# Patient Record
Sex: Male | Born: 1945 | Race: White | Hispanic: No | Marital: Married | Smoking: Former smoker
Health system: Southern US, Community
[De-identification: ages and names within clinical notes are randomized; demographics above are authoritative.]

## PROBLEM LIST (undated history)

## (undated) DIAGNOSIS — D869 Sarcoidosis, unspecified: Secondary | ICD-10-CM

## (undated) DIAGNOSIS — Z87442 Personal history of urinary calculi: Secondary | ICD-10-CM

## (undated) DIAGNOSIS — I2510 Atherosclerotic heart disease of native coronary artery without angina pectoris: Secondary | ICD-10-CM

## (undated) DIAGNOSIS — E119 Type 2 diabetes mellitus without complications: Secondary | ICD-10-CM

## (undated) DIAGNOSIS — I10 Essential (primary) hypertension: Secondary | ICD-10-CM

## (undated) DIAGNOSIS — G629 Polyneuropathy, unspecified: Secondary | ICD-10-CM

## (undated) DIAGNOSIS — I639 Cerebral infarction, unspecified: Secondary | ICD-10-CM

## (undated) DIAGNOSIS — C801 Malignant (primary) neoplasm, unspecified: Secondary | ICD-10-CM

## (undated) HISTORY — PX: KNEE SURGERY: SHX244

## (undated) HISTORY — PX: CARPAL TUNNEL RELEASE: SHX101

## (undated) HISTORY — PX: JOINT REPLACEMENT: SHX530

## (undated) HISTORY — PX: HERNIA REPAIR: SHX51

## (undated) HISTORY — PX: COLONOSCOPY: SHX174

## (undated) HISTORY — PX: TONSILLECTOMY: SUR1361

## (undated) HISTORY — PX: LAPAROSCOPIC ROUX-EN-Y GASTRIC BYPASS WITH UPPER ENDOSCOPY AND REMOVAL OF LAP BAND: SHX6505

## (undated) HISTORY — PX: CHOLECYSTECTOMY: SHX55

## (undated) HISTORY — PX: SPINAL FUSION: SHX223

---

## 2015-07-04 DIAGNOSIS — M47817 Spondylosis without myelopathy or radiculopathy, lumbosacral region: Secondary | ICD-10-CM | POA: Insufficient documentation

## 2015-07-24 DIAGNOSIS — M5126 Other intervertebral disc displacement, lumbar region: Secondary | ICD-10-CM | POA: Insufficient documentation

## 2017-06-30 DIAGNOSIS — I10 Essential (primary) hypertension: Secondary | ICD-10-CM | POA: Insufficient documentation

## 2018-01-29 DIAGNOSIS — M1712 Unilateral primary osteoarthritis, left knee: Secondary | ICD-10-CM | POA: Insufficient documentation

## 2019-07-06 ENCOUNTER — Other Ambulatory Visit: Payer: Self-pay | Admitting: Family Medicine

## 2019-07-06 DIAGNOSIS — M542 Cervicalgia: Secondary | ICD-10-CM

## 2019-07-06 DIAGNOSIS — M4722 Other spondylosis with radiculopathy, cervical region: Secondary | ICD-10-CM

## 2019-07-14 ENCOUNTER — Ambulatory Visit
Admission: RE | Admit: 2019-07-14 | Discharge: 2019-07-14 | Disposition: A | Discharge status: Home / Self Care | Payer: Medicare Other | Source: Ambulatory Visit | Attending: Family Medicine | Admitting: Family Medicine

## 2019-07-14 ENCOUNTER — Other Ambulatory Visit: Payer: Self-pay

## 2019-07-14 DIAGNOSIS — M4722 Other spondylosis with radiculopathy, cervical region: Secondary | ICD-10-CM

## 2019-07-14 DIAGNOSIS — M542 Cervicalgia: Secondary | ICD-10-CM

## 2019-07-18 ENCOUNTER — Emergency Department (HOSPITAL_COMMUNITY): Payer: Medicare Other

## 2019-07-18 ENCOUNTER — Encounter (HOSPITAL_COMMUNITY): Payer: Self-pay

## 2019-07-18 ENCOUNTER — Emergency Department (HOSPITAL_COMMUNITY)
Admission: EM | Admit: 2019-07-18 | Discharge: 2019-07-18 | Disposition: A | Discharge status: Home / Self Care | Payer: Medicare Other | Attending: Emergency Medicine | Admitting: Emergency Medicine

## 2019-07-18 ENCOUNTER — Other Ambulatory Visit: Payer: Self-pay

## 2019-07-18 DIAGNOSIS — I10 Essential (primary) hypertension: Secondary | ICD-10-CM | POA: Insufficient documentation

## 2019-07-18 DIAGNOSIS — N132 Hydronephrosis with renal and ureteral calculous obstruction: Secondary | ICD-10-CM | POA: Insufficient documentation

## 2019-07-18 DIAGNOSIS — E119 Type 2 diabetes mellitus without complications: Secondary | ICD-10-CM | POA: Insufficient documentation

## 2019-07-18 DIAGNOSIS — Z7984 Long term (current) use of oral hypoglycemic drugs: Secondary | ICD-10-CM | POA: Insufficient documentation

## 2019-07-18 DIAGNOSIS — Z79899 Other long term (current) drug therapy: Secondary | ICD-10-CM | POA: Insufficient documentation

## 2019-07-18 DIAGNOSIS — R1011 Right upper quadrant pain: Secondary | ICD-10-CM | POA: Diagnosis present

## 2019-07-18 DIAGNOSIS — N200 Calculus of kidney: Secondary | ICD-10-CM

## 2019-07-18 HISTORY — DX: Type 2 diabetes mellitus without complications: E11.9

## 2019-07-18 HISTORY — DX: Essential (primary) hypertension: I10

## 2019-07-18 LAB — CBC WITH DIFFERENTIAL/PLATELET
Abs Immature Granulocytes: 0.06 10*3/uL (ref 0.00–0.07)
Basophils Absolute: 0.1 10*3/uL (ref 0.0–0.1)
Basophils Relative: 1 %
Eosinophils Absolute: 0.4 10*3/uL (ref 0.0–0.5)
Eosinophils Relative: 3 %
HCT: 45.7 % (ref 39.0–52.0)
Hemoglobin: 15.4 g/dL (ref 13.0–17.0)
Immature Granulocytes: 1 %
Lymphocytes Relative: 9 %
Lymphs Abs: 1.2 10*3/uL (ref 0.7–4.0)
MCH: 33.1 pg (ref 26.0–34.0)
MCHC: 33.7 g/dL (ref 30.0–36.0)
MCV: 98.3 fL (ref 80.0–100.0)
Monocytes Absolute: 1.1 10*3/uL — ABNORMAL HIGH (ref 0.1–1.0)
Monocytes Relative: 8 %
Neutro Abs: 10.1 10*3/uL — ABNORMAL HIGH (ref 1.7–7.7)
Neutrophils Relative %: 78 %
Platelets: 213 10*3/uL (ref 150–400)
RBC: 4.65 MIL/uL (ref 4.22–5.81)
RDW: 12.3 % (ref 11.5–15.5)
WBC: 13 10*3/uL — ABNORMAL HIGH (ref 4.0–10.5)
nRBC: 0 % (ref 0.0–0.2)

## 2019-07-18 LAB — URINALYSIS, ROUTINE W REFLEX MICROSCOPIC
Bacteria, UA: NONE SEEN
Bilirubin Urine: NEGATIVE
Glucose, UA: NEGATIVE mg/dL
Ketones, ur: NEGATIVE mg/dL
Leukocytes,Ua: NEGATIVE
Nitrite: NEGATIVE
Protein, ur: NEGATIVE mg/dL
Specific Gravity, Urine: 1.012 (ref 1.005–1.030)
pH: 5 (ref 5.0–8.0)

## 2019-07-18 LAB — COMPREHENSIVE METABOLIC PANEL
ALT: 49 U/L — ABNORMAL HIGH (ref 0–44)
AST: 35 U/L (ref 15–41)
Albumin: 4.6 g/dL (ref 3.5–5.0)
Alkaline Phosphatase: 52 U/L (ref 38–126)
Anion gap: 14 (ref 5–15)
BUN: 20 mg/dL (ref 8–23)
CO2: 23 mmol/L (ref 22–32)
Calcium: 9.8 mg/dL (ref 8.9–10.3)
Chloride: 101 mmol/L (ref 98–111)
Creatinine, Ser: 0.84 mg/dL (ref 0.61–1.24)
GFR calc Af Amer: >60 mL/min (ref >60)
GFR calc non Af Amer: >60 mL/min (ref >60)
Glucose, Bld: 180 mg/dL — ABNORMAL HIGH (ref 70–99)
Potassium: 4.5 mmol/L (ref 3.5–5.1)
Sodium: 138 mmol/L (ref 135–145)
Total Bilirubin: 1 mg/dL (ref 0.3–1.2)
Total Protein: 7.6 g/dL (ref 6.5–8.1)

## 2019-07-18 LAB — LIPASE, BLOOD: Lipase: 36 U/L (ref 11–51)

## 2019-07-18 MED ORDER — IOHEXOL 300 MG/ML  SOLN
100.0000 mL | Freq: Once | INTRAMUSCULAR | Status: AC | PRN
  Administered 2019-07-18: 15:00:00 100 mL via INTRAVENOUS

## 2019-07-18 MED ORDER — MORPHINE SULFATE 15 MG PO TABS: 15.0000 mg | ORAL_TABLET | ORAL | 0 refills | Status: DC | PRN

## 2019-07-18 MED ORDER — FENTANYL CITRATE (PF) 100 MCG/2ML IJ SOLN
50.0000 ug | Freq: Once | INTRAMUSCULAR | Status: AC
  Administered 2019-07-18: 13:00:00 50 ug via INTRAVENOUS
  Filled 2019-07-18: qty 2

## 2019-07-18 MED ORDER — SODIUM CHLORIDE 0.9 % IV SOLN
INTRAVENOUS | Status: DC
  Administered 2019-07-18: 13:00:00 via INTRAVENOUS

## 2019-07-18 MED ORDER — TAMSULOSIN HCL 0.4 MG PO CAPS: 0.4000 mg | ORAL_CAPSULE | Freq: Every day | ORAL | 0 refills | Status: DC

## 2019-07-18 MED ORDER — HYDROMORPHONE HCL 1 MG/ML IJ SOLN
1.0000 mg | Freq: Once | INTRAMUSCULAR | Status: AC
  Administered 2019-07-18: 16:00:00 1 mg via INTRAVENOUS
  Filled 2019-07-18: qty 1

## 2019-07-18 MED ORDER — ONDANSETRON 4 MG PO TBDP: ORAL_TABLET | ORAL | 0 refills | Status: DC

## 2019-07-18 MED ORDER — HYDROMORPHONE HCL 1 MG/ML IJ SOLN
1.0000 mg | Freq: Once | INTRAMUSCULAR | Status: AC
  Administered 2019-07-18: 15:00:00 1 mg via INTRAVENOUS
  Filled 2019-07-18: qty 1

## 2019-07-18 MED ORDER — SODIUM CHLORIDE (PF) 0.9 % IJ SOLN
INTRAMUSCULAR | Status: AC
  Filled 2019-07-18: qty 50

## 2019-07-18 MED ORDER — ONDANSETRON HCL 4 MG/2ML IJ SOLN
4.0000 mg | Freq: Once | INTRAMUSCULAR | Status: AC
  Administered 2019-07-18: 13:00:00 4 mg via INTRAVENOUS
  Filled 2019-07-18: qty 2

## 2019-07-18 MED ORDER — HYDROMORPHONE HCL 1 MG/ML IJ SOLN
1.0000 mg | Freq: Once | INTRAMUSCULAR | Status: AC
  Administered 2019-07-18: 14:00:00 1 mg via INTRAVENOUS
  Filled 2019-07-18: qty 1

## 2019-07-18 MED ORDER — KETOROLAC TROMETHAMINE 30 MG/ML IJ SOLN
15.0000 mg | Freq: Once | INTRAMUSCULAR | Status: AC
  Administered 2019-07-18: 15 mg via INTRAVENOUS
  Filled 2019-07-18: qty 1

## 2019-07-18 NOTE — ED Notes (Signed)
Pt returned from CT °

## 2019-07-18 NOTE — ED Notes (Signed)
Patient transported to CT 

## 2019-07-18 NOTE — ED Provider Notes (Signed)
[facility] DEPT Provider Note   CSN: [id number] Arrival date & time: 07/18/19  1151     History   Chief Complaint Chief Complaint  Patient presents with   Abdominal Pain    HPI Max Ryan is a 73 y.o. male.     HPI Patient presents with concern of abdominal pain. Patient awoke today and was in his usual state of health, soon thereafter, about 2 hours prior to ED arrival he developed sharp pain, in the right lower quadrant. There is associated nausea, vomiting. Pain is sharp, severe, nonradiating, not improved with anything including OTC medication. No diarrhea, no dysuria, no hematuria.   Patient initially states that he has no history of abdominal surgery, but he does have a history of cholecystectomy.  Past Medical History:  Diagnosis Date   Diabetes mellitus without complication ([place])    Hypertension     There are no active problems to display for this patient.   Past Surgical History:  Procedure Laterality Date   CHOLECYSTECTOMY     HERNIA REPAIR     KNEE SURGERY Left    LAPAROSCOPIC ROUX-EN-Y GASTRIC BYPASS WITH UPPER ENDOSCOPY AND REMOVAL OF LAP BAND     SPINAL FUSION          Home Medications    Prior to Admission medications   Medication Sig Start Date End Date Taking? Authorizing Provider  Carbidopa 25 MG tablet Take 25 tablets by mouth 3 (three) times daily.   Yes [provider]  diclofenac (VOLTAREN) 50 MG EC tablet Take 50 mg by mouth 2 (two) times daily.   Yes [provider]  gabapentin (NEURONTIN) 600 MG tablet Take 600 mg by mouth 2 (two) times daily.   Yes [provider]  losartan (COZAAR) 25 MG tablet Take 25 mg by mouth daily.   Yes [provider]  lovastatin (MEVACOR) 10 MG tablet Take 10 mg by mouth daily.    Yes [provider]  metFORMIN (GLUCOPHAGE) 500 MG tablet Take 500 mg by mouth 2 (two) times daily.   Yes [provider]    oxyCODONE-acetaminophen (PERCOCET) 10-325 MG tablet Take 1 tablet by mouth every 4 (four) hours as needed for pain.   Yes [provider]    Family History No family history on file.  Social History Social History   Tobacco Use   Smoking status: Never Smoker   Smokeless tobacco: Never Used  Substance Use Topics   Alcohol use: Yes    Alcohol/week: 28.0 standard drinks    Types: 28 Glasses of wine per week   Drug use: Yes    Types: Marijuana     Allergies   Patient has no known allergies.   Review of Systems Review of Systems  Constitutional:       Per HPI, otherwise negative  HENT:       Per HPI, otherwise negative  Respiratory:       Per HPI, otherwise negative  Cardiovascular:       Per HPI, otherwise negative  Gastrointestinal: Positive for abdominal pain and nausea. Negative for vomiting.  Endocrine:       Negative aside from HPI  Genitourinary:       Neg aside from HPI   Musculoskeletal:       Per HPI, otherwise negative  Skin: Negative.   Neurological: Negative for syncope.     Physical Exam Updated Vital Signs BP (!) 188/108    Pulse 67  Temp 98.2 F (36.8 C) (Oral)    Resp 18    Ht 5\' 8"  (1.727 m)    Wt 113.4 kg    SpO2 92%    BMI 38.01 kg/m   Physical Exam Vitals signs and nursing note reviewed.  Constitutional:      Appearance: He is well-developed. He is obese.     Comments: Uncomfortable appearing large adult male awake and alert  HENT:     Head: Normocephalic and atraumatic.  Eyes:     Conjunctiva/sclera: Conjunctivae normal.  Cardiovascular:     Rate and Rhythm: Normal rate and regular rhythm.  Pulmonary:     Effort: Pulmonary effort is normal. No respiratory distress.     Breath sounds: No stridor.  Abdominal:     General: There is no distension.     Tenderness: There is abdominal tenderness in the right lower quadrant.  Skin:    General: Skin is warm and dry.  Neurological:     Mental Status: He is alert and  oriented to person, place, and time.      ED Treatments / Results  Labs (all labs ordered are listed, but only abnormal results are displayed) Labs Reviewed  COMPREHENSIVE METABOLIC PANEL - Abnormal; Notable for the following components:      Result Value   Glucose, Bld 180 (*)    ALT 49 (*)    All other components within normal limits  CBC WITH DIFFERENTIAL/PLATELET - Abnormal; Notable for the following components:   WBC 13.0 (*)    Neutro Abs 10.1 (*)    Monocytes Absolute 1.1 (*)    All other components within normal limits  URINALYSIS, ROUTINE W REFLEX MICROSCOPIC - Abnormal; Notable for the following components:   Hgb urine dipstick MODERATE (*)    All other components within normal limits  LIPASE, BLOOD    EKG None  Radiology Ct Abdomen Pelvis W Contrast  Result Date: 07/18/2019 CLINICAL DATA:  Right lower quadrant abdominal pain for the past 2 hours. Nausea and 1 episode of vomiting. EXAM: CT ABDOMEN AND PELVIS WITH CONTRAST TECHNIQUE: Multidetector CT imaging of the abdomen and pelvis was performed using the standard protocol following bolus administration of intravenous contrast. CONTRAST:  19mL OMNIPAQUE IOHEXOL 300 MG/ML  SOLN COMPARISON:  None. FINDINGS: Lower chest: Atheromatous coronary artery calcifications. Subsegmental atelectasis or scarring in the left lower lobe and lingula. Small right lower lobe calcified granuloma. Hepatobiliary: Marked diffuse low density of the liver relative to the spleen. Cholecystectomy clips. Pancreas: Unremarkable. No pancreatic ductal dilatation or surrounding inflammatory changes. Spleen: Normal in size without focal abnormality. 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 There is a 6 mm calculus in the distal ureter just proximal to the ureterovesical junction and a 2 mm calculus at the ureterovesical junction. There is  significantly delayed contrast excretion on the right, with no collecting system contrast on the delayed images. Normal appearing collecting system and ureter on the left. Unremarkable urinary bladder. Stomach/Bowel: Proximal gastric band and associated tubing. Normal appearing small bowel, colon and appendix. Vascular/Lymphatic: Atheromatous arterial calcifications without aneurysm. No enlarged lymph nodes. Reproductive: Normal sized prostate gland containing coarse calcifications. Other: Small to moderate-sized bilateral inguinal hernias containing fat. Tiny umbilical hernia containing fat. Musculoskeletal: Interbody and pedicle screw and rod fixation at the L3 through S1 levels. Lumbar and lower thoracic spine degenerative changes. IMPRESSION: 1. 6 mm and 2 mm distal right ureteral  calculi, causing mild-to-moderate right hydronephrosis and hydroureter. 2. Horseshoe kidney. 3. Marked diffuse hepatic steatosis. 4. Small to moderate-sized bilateral inguinal hernias containing fat. Electronically Signed   By: Claudie Revering M.D.   On: 07/18/2019 15:24    Procedures Procedures (including critical care time)  Medications Ordered in ED Medications  0.9 %  sodium chloride infusion ( Intravenous Bolus from Bag 07/18/19 1238)  sodium chloride (PF) 0.9 % injection (has no administration in time range)  fentaNYL (SUBLIMAZE) injection 50 mcg (50 mcg Intravenous Given 07/18/19 1239)  ondansetron (ZOFRAN) injection 4 mg (4 mg Intravenous Given 07/18/19 1238)  HYDROmorphone (DILAUDID) injection 1 mg (1 mg Intravenous Given 07/18/19 1336)  iohexol (OMNIPAQUE) 300 MG/ML solution 100 mL (100 mLs Intravenous Contrast Given 07/18/19 1453)  HYDROmorphone (DILAUDID) injection 1 mg (1 mg Intravenous Given 07/18/19 1519)     Initial Impression / Assessment and Plan / ED Course  I have reviewed the triage vital signs and the nursing notes.  Pertinent labs & imaging results that were available during my care of the patient were  reviewed by me and considered in my medical decision making (see chart for details).    3:32 PM Patient much more comfortable after Dilaudid, after initial fentanyl did not change his pain substantially. Patient went for CT scan, but IV was dislodged, and there is been a delay in care. Initial labs notable for leukocytosis, with left shift, heme positive urine, with concern for infection versus stone.  Patient now completed by his wife who is aware of findings thus far.    3:32 PM Patient's pain somewhat controlled, though he has recurrence, requiring additional Dilaudid. Patient signed out to Dr. Tyrone Nine, with concern for stone versus appendectomy, with CT results pending.  On chart review is clear the patient had horseshoe kidney, 2 right-sided ureteral stones, requiring additional monitoring, management, performed by Dr. Tyrone Nine.  Final Clinical Impressions(s) / ED Diagnoses  Nephrolithiasis   Carmin Muskrat, MD 07/18/19 [phone]

## 2019-07-18 NOTE — ED Notes (Addendum)
Informed patient that a urine sample is needed. Pt stating he cannot provide urine sample at this time. Encouraged pt to try. Pt is sitting on the side of the bed at this time and family is at bedside.

## 2019-07-18 NOTE — ED Notes (Signed)
Urine culture sent to lab with urine sample.  

## 2019-07-18 NOTE — ED Notes (Signed)
Pt c/o pains are back up in right flank, rating 7/10. Made Dr Vanita Panda aware, awaiting new orders.

## 2019-07-18 NOTE — Discharge Instructions (Addendum)
As discussed please return for worsening pain or fever follow up with urology.   Take 4 over the counter ibuprofen tablets 3 times a day or 2 over-the-counter naproxen tablets twice a day for pain. Also take tylenol 1000mg (2 extra strength) four times a day.   Then take the pain medicine if you feel like you need it. Narcotics do not help with the pain, they only make you care about it less.  You can become addicted to this, people may break into your house to steal it.  It will constipate you.  If you drive under the influence of this medicine you can get a DUI.

## 2019-07-18 NOTE — ED Notes (Signed)
ED Provider at bedside. 

## 2019-07-18 NOTE — ED Triage Notes (Signed)
Pt BIBA from home. Per EMS, pt c/o RLQ pain x 2 hours. Endorses nausea, emesis x 1. No other complaints at this time.  180/100

## 2019-07-18 NOTE — ED Provider Notes (Signed)
73 yo M with a chief complaints of right flank pain.  Started 2 hours prior to arrival here.  I received the patient in signout from Dr. Vanita Panda.  Patient awaiting read from CT scan.  Found to have 2 stones at the UVJ.  Patient is having severe pain after 3 doses of narcotics.  Will discuss with urology.  Discussed with Dr. Jeffie Pollock.  He felt it was okay to give this patient Toradol.  Patient given Toradol and another milligram of Dilaudid with significant improvement of his symptoms.  He was observed in the ED for about an hour and a half afterwards and was still feeling well.  Requesting discharge home.  Urology follow-up.   Deno Etienne, DO 07/18/19 2002

## 2019-07-19 ENCOUNTER — Encounter (HOSPITAL_COMMUNITY)
Admission: AD | Disposition: A | Discharge status: Home / Self Care | Payer: Self-pay | Source: Ambulatory Visit | Attending: Urology

## 2019-07-19 ENCOUNTER — Ambulatory Visit (HOSPITAL_COMMUNITY): Payer: Medicare Other

## 2019-07-19 ENCOUNTER — Ambulatory Visit (HOSPITAL_COMMUNITY): Payer: Medicare Other | Admitting: Anesthesiology

## 2019-07-19 ENCOUNTER — Ambulatory Visit (HOSPITAL_COMMUNITY)
Admission: AD | Admit: 2019-07-19 | Discharge: 2019-07-19 | Disposition: A | Discharge status: Home / Self Care | Payer: Medicare Other | Source: Ambulatory Visit | Attending: Urology | Admitting: Urology

## 2019-07-19 ENCOUNTER — Other Ambulatory Visit: Payer: Self-pay | Admitting: Urology

## 2019-07-19 ENCOUNTER — Other Ambulatory Visit (HOSPITAL_COMMUNITY)
Admission: RE | Admit: 2019-07-19 | Discharge: 2019-07-19 | Disposition: A | Discharge status: Home / Self Care | Payer: Medicare Other | Source: Ambulatory Visit | Attending: Urology | Admitting: Urology

## 2019-07-19 ENCOUNTER — Encounter (HOSPITAL_COMMUNITY): Payer: Self-pay | Admitting: General Practice

## 2019-07-19 DIAGNOSIS — M1990 Unspecified osteoarthritis, unspecified site: Secondary | ICD-10-CM | POA: Diagnosis not present

## 2019-07-19 DIAGNOSIS — N201 Calculus of ureter: Secondary | ICD-10-CM | POA: Diagnosis present

## 2019-07-19 DIAGNOSIS — Z79899 Other long term (current) drug therapy: Secondary | ICD-10-CM | POA: Diagnosis not present

## 2019-07-19 DIAGNOSIS — N200 Calculus of kidney: Secondary | ICD-10-CM

## 2019-07-19 DIAGNOSIS — I10 Essential (primary) hypertension: Secondary | ICD-10-CM | POA: Diagnosis not present

## 2019-07-19 DIAGNOSIS — Q631 Lobulated, fused and horseshoe kidney: Secondary | ICD-10-CM | POA: Diagnosis not present

## 2019-07-19 DIAGNOSIS — E119 Type 2 diabetes mellitus without complications: Secondary | ICD-10-CM | POA: Diagnosis not present

## 2019-07-19 DIAGNOSIS — Z87891 Personal history of nicotine dependence: Secondary | ICD-10-CM | POA: Insufficient documentation

## 2019-07-19 DIAGNOSIS — M109 Gout, unspecified: Secondary | ICD-10-CM | POA: Insufficient documentation

## 2019-07-19 DIAGNOSIS — Z20828 Contact with and (suspected) exposure to other viral communicable diseases: Secondary | ICD-10-CM | POA: Insufficient documentation

## 2019-07-19 DIAGNOSIS — E7800 Pure hypercholesterolemia, unspecified: Secondary | ICD-10-CM | POA: Diagnosis not present

## 2019-07-19 DIAGNOSIS — Z7984 Long term (current) use of oral hypoglycemic drugs: Secondary | ICD-10-CM | POA: Diagnosis not present

## 2019-07-19 DIAGNOSIS — N400 Enlarged prostate without lower urinary tract symptoms: Secondary | ICD-10-CM | POA: Diagnosis not present

## 2019-07-19 DIAGNOSIS — G4730 Sleep apnea, unspecified: Secondary | ICD-10-CM | POA: Diagnosis not present

## 2019-07-19 HISTORY — PX: CYSTOSCOPY/URETEROSCOPY/HOLMIUM LASER/STENT PLACEMENT: SHX6546

## 2019-07-19 HISTORY — DX: Personal history of urinary calculi: Z87.442

## 2019-07-19 LAB — GLUCOSE, CAPILLARY
Glucose-Capillary: 156 mg/dL — ABNORMAL HIGH (ref 70–99)
Glucose-Capillary: 164 mg/dL — ABNORMAL HIGH (ref 70–99)

## 2019-07-19 LAB — SARS CORONAVIRUS 2 BY RT PCR (HOSPITAL ORDER, PERFORMED IN ~~LOC~~ HOSPITAL LAB): SARS Coronavirus 2: NEGATIVE

## 2019-07-19 SURGERY — CYSTOSCOPY/URETEROSCOPY/HOLMIUM LASER/STENT PLACEMENT
Anesthesia: General | Site: Ureter | Laterality: Right

## 2019-07-19 MED ORDER — SODIUM CHLORIDE 0.9 % IV SOLN
INTRAVENOUS | Status: DC | PRN
  Administered 2019-07-19: 20 mL
  Administered 2019-07-19: 4 mL

## 2019-07-19 MED ORDER — PROMETHAZINE HCL 25 MG/ML IJ SOLN: 6.2500 mg | INTRAMUSCULAR | Status: DC | PRN

## 2019-07-19 MED ORDER — TRAMADOL HCL 50 MG PO TABS: 50.0000 mg | ORAL_TABLET | Freq: Four times a day (QID) | ORAL | 0 refills | Status: DC | PRN

## 2019-07-19 MED ORDER — PHENAZOPYRIDINE HCL 200 MG PO TABS: 200.0000 mg | ORAL_TABLET | Freq: Three times a day (TID) | ORAL | 0 refills | Status: DC | PRN

## 2019-07-19 MED ORDER — PHENYLEPHRINE 40 MCG/ML (10ML) SYRINGE FOR IV PUSH (FOR BLOOD PRESSURE SUPPORT)
PREFILLED_SYRINGE | INTRAVENOUS | Status: AC
  Filled 2019-07-19: qty 30

## 2019-07-19 MED ORDER — PHENYLEPHRINE 40 MCG/ML (10ML) SYRINGE FOR IV PUSH (FOR BLOOD PRESSURE SUPPORT)
PREFILLED_SYRINGE | INTRAVENOUS | Status: DC | PRN
  Administered 2019-07-19 (×2): 120 ug via INTRAVENOUS
  Administered 2019-07-19: 80 ug via INTRAVENOUS
  Administered 2019-07-19: 120 ug via INTRAVENOUS
  Administered 2019-07-19: 80 ug via INTRAVENOUS
  Administered 2019-07-19 (×2): 120 ug via INTRAVENOUS

## 2019-07-19 MED ORDER — FENTANYL CITRATE (PF) 100 MCG/2ML IJ SOLN: 25.0000 ug | INTRAMUSCULAR | Status: DC | PRN

## 2019-07-19 MED ORDER — LIDOCAINE 2% (20 MG/ML) 5 ML SYRINGE
INTRAMUSCULAR | Status: DC | PRN
  Administered 2019-07-19: 100 mg via INTRAVENOUS

## 2019-07-19 MED ORDER — ONDANSETRON HCL 4 MG/2ML IJ SOLN
INTRAMUSCULAR | Status: DC | PRN
  Administered 2019-07-19: 4 mg via INTRAVENOUS

## 2019-07-19 MED ORDER — ACETAMINOPHEN 500 MG PO TABS
1000.0000 mg | ORAL_TABLET | Freq: Once | ORAL | Status: AC
  Administered 2019-07-19: 15:00:00 1000 mg via ORAL
  Filled 2019-07-19: qty 2

## 2019-07-19 MED ORDER — PHENYLEPHRINE 40 MCG/ML (10ML) SYRINGE FOR IV PUSH (FOR BLOOD PRESSURE SUPPORT)
PREFILLED_SYRINGE | INTRAVENOUS | Status: AC
  Filled 2019-07-19: qty 10

## 2019-07-19 MED ORDER — LACTATED RINGERS IV SOLN
INTRAVENOUS | Status: DC
  Administered 2019-07-19 (×2): via INTRAVENOUS

## 2019-07-19 MED ORDER — EPHEDRINE SULFATE-NACL 50-0.9 MG/10ML-% IV SOSY
PREFILLED_SYRINGE | INTRAVENOUS | Status: DC | PRN
  Administered 2019-07-19 (×4): 5 mg via INTRAVENOUS

## 2019-07-19 MED ORDER — CELECOXIB 200 MG PO CAPS
400.0000 mg | ORAL_CAPSULE | Freq: Once | ORAL | Status: AC
  Administered 2019-07-19: 15:00:00 400 mg via ORAL
  Filled 2019-07-19 (×2): qty 2

## 2019-07-19 MED ORDER — CIPROFLOXACIN HCL 500 MG PO TABS: 500.0000 mg | ORAL_TABLET | Freq: Once | ORAL | 0 refills | Status: AC

## 2019-07-19 MED ORDER — LIDOCAINE 2% (20 MG/ML) 5 ML SYRINGE
INTRAMUSCULAR | Status: AC
  Filled 2019-07-19: qty 5

## 2019-07-19 MED ORDER — PROPOFOL 10 MG/ML IV BOLUS
INTRAVENOUS | Status: DC | PRN
  Administered 2019-07-19: 20 mg via INTRAVENOUS
  Administered 2019-07-19: 100 mg via INTRAVENOUS

## 2019-07-19 MED ORDER — FENTANYL CITRATE (PF) 100 MCG/2ML IJ SOLN
INTRAMUSCULAR | Status: DC | PRN
  Administered 2019-07-19 (×3): 50 ug via INTRAVENOUS

## 2019-07-19 MED ORDER — DEXAMETHASONE SODIUM PHOSPHATE 10 MG/ML IJ SOLN
INTRAMUSCULAR | Status: AC
  Filled 2019-07-19: qty 1

## 2019-07-19 MED ORDER — ONDANSETRON HCL 4 MG/2ML IJ SOLN
INTRAMUSCULAR | Status: AC
  Filled 2019-07-19: qty 2

## 2019-07-19 MED ORDER — SODIUM CHLORIDE 0.9 % IR SOLN
Status: DC | PRN
  Administered 2019-07-19 (×2): 3000 mL via INTRAVESICAL

## 2019-07-19 MED ORDER — DEXAMETHASONE SODIUM PHOSPHATE 10 MG/ML IJ SOLN
INTRAMUSCULAR | Status: DC | PRN
  Administered 2019-07-19: 10 mg via INTRAVENOUS

## 2019-07-19 MED ORDER — FENTANYL CITRATE (PF) 100 MCG/2ML IJ SOLN
INTRAMUSCULAR | Status: AC
  Filled 2019-07-19: qty 2

## 2019-07-19 MED ORDER — CEFAZOLIN SODIUM-DEXTROSE 2-4 GM/100ML-% IV SOLN
2.0000 g | INTRAVENOUS | Status: AC
  Administered 2019-07-19: 16:00:00 2 g via INTRAVENOUS
  Filled 2019-07-19: qty 100

## 2019-07-19 MED ORDER — 0.9 % SODIUM CHLORIDE (POUR BTL) OPTIME
TOPICAL | Status: DC | PRN
  Administered 2019-07-19: 1000 mL

## 2019-07-19 SURGICAL SUPPLY — 25 items
BAG URO CATCHER STRL LF (MISCELLANEOUS) ×3 IMPLANT
BASKET ZERO TIP NITINOL 2.4FR (BASKET) IMPLANT
BENZOIN TINCTURE PRP APPL 2/3 (GAUZE/BANDAGES/DRESSINGS) ×2 IMPLANT
CATH URET 5FR 28IN OPEN ENDED (CATHETERS) ×3 IMPLANT
CLOTH BEACON ORANGE TIMEOUT ST (SAFETY) ×3 IMPLANT
COVER WAND RF STERILE (DRAPES) IMPLANT
DRSG TEGADERM 2-3/8X2-3/4 SM (GAUZE/BANDAGES/DRESSINGS) ×2 IMPLANT
EXTRACTOR STONE 1.7FRX115CM (UROLOGICAL SUPPLIES) IMPLANT
EXTRACTOR STONE NITINOL NGAGE (UROLOGICAL SUPPLIES) ×2 IMPLANT
FIBER LASER FLEXIVA 365 (UROLOGICAL SUPPLIES) ×2 IMPLANT
GLOVE BIOGEL M STRL SZ7.5 (GLOVE) ×3 IMPLANT
GOWN STRL REUS W/TWL XL LVL3 (GOWN DISPOSABLE) ×3 IMPLANT
GUIDEWIRE ANG ZIPWIRE 038X150 (WIRE) IMPLANT
GUIDEWIRE STR DUAL SENSOR (WIRE) ×5 IMPLANT
KIT TURNOVER KIT A (KITS) IMPLANT
MANIFOLD NEPTUNE II (INSTRUMENTS) ×3 IMPLANT
PACK CYSTO (CUSTOM PROCEDURE TRAY) ×3 IMPLANT
SHEATH URETERAL 12FRX28CM (UROLOGICAL SUPPLIES) IMPLANT
SHEATH URETERAL 12FRX35CM (MISCELLANEOUS) IMPLANT
STENT URET 6FRX26 CONTOUR (STENTS) ×2 IMPLANT
SYRINGE IRR TOOMEY STRL 70CC (SYRINGE) ×2 IMPLANT
TUBING CONNECTING 10 (TUBING) ×2 IMPLANT
TUBING CONNECTING 10' (TUBING) ×1
TUBING UROLOGY SET (TUBING) ×3 IMPLANT
WIRE COONS/BENSON .038X145CM (WIRE) IMPLANT

## 2019-07-19 NOTE — Discharge Instructions (Signed)

## 2019-07-19 NOTE — Anesthesia Preprocedure Evaluation (Addendum)
Anesthesia Evaluation  Patient identified by MRN, date of birth, ID band Patient awake    Reviewed: Allergy & Precautions, NPO status , Patient's Chart, lab work & pertinent test results  History of Anesthesia Complications Negative for: history of anesthetic complications  Airway Mallampati: II  TM Distance: >3 FB Neck ROM: Full    Dental no notable dental hx. (+) Dental Advisory Given   Pulmonary neg pulmonary ROS,    Pulmonary exam normal        Cardiovascular hypertension, Pt. 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 neurological ROS  negative psych ROS   GI/Hepatic negative GI ROS, Neg liver ROS,   Endo/Other  diabetes  Renal/GU   negative genitourinary   Musculoskeletal negative musculoskeletal ROS (+)   Abdominal   Peds negative pediatric ROS (+)  Hematology negative hematology ROS (+)   Anesthesia Other Findings   Reproductive/Obstetrics negative OB ROS                           Anesthesia Physical Anesthesia Plan  ASA: III  Anesthesia Plan: General   Post-op Pain Management:    Induction: Intravenous  PONV Risk Score and Plan: 2 and Ondansetron and Dexamethasone  Airway Management Planned: LMA  Additional Equipment:   Intra-op Plan:   Post-operative Plan: Extubation in OR  Informed Consent: I have reviewed the patients History and Physical, chart, labs and discussed the procedure including the risks, benefits and alternatives for the proposed anesthesia with the patient or authorized representative who has indicated his/her understanding and acceptance.     Dental advisory given  Plan Discussed with: CRNA and Anesthesiologist  Anesthesia Plan Comments:         Anesthesia Quick Evaluation

## 2019-07-19 NOTE — Interval H&P Note (Signed)
History and Physical Interval Note:  07/19/2019 3:38 PM  Max Ryan  has presented today for surgery, with the diagnosis of right ureteral obstruction.  The various methods of treatment have been discussed with the patient and family. After consideration of risks, benefits and other options for treatment, the patient has consented to  Procedure(s): CYSTOSCOPY/URETEROSCOPY/HOLMIUM LASER/STENT PLACEMENT (Right) as a surgical intervention.  The patient's history has been reviewed, patient examined, no change in status, stable for surgery.  I have reviewed the patient's chart and labs.  Questions were answered to the patient's satisfaction.     Ardis Hughs

## 2019-07-19 NOTE — Transfer of Care (Signed)
Immediate Anesthesia Transfer of Care Note  Patient: Max Ryan  Procedure(s) Performed: CYSTOSCOPY/URETEROSCOPY/HOLMIUM LASER/STENT PLACEMENT (Right Ureter)  Patient Location: PACU  Anesthesia Type:General  Level of Consciousness: drowsy, patient cooperative and responds to stimulation  Airway & Oxygen Therapy: Patient Spontanous Breathing and Patient connected to face mask oxygen  Post-op Assessment: Report given to RN and Post -op Vital signs reviewed and stable  Post vital signs: Reviewed and stable  Last Vitals:  Vitals Value Taken Time  BP 121/71 07/19/19 1717  Temp    Pulse 72 07/19/19 1719  Resp 18 07/19/19 1719  SpO2 100 % 07/19/19 1719  Vitals shown include unvalidated device data.  Last Pain:  Vitals:   07/19/19 1406  TempSrc:   PainSc: 0-No pain         Complications: No apparent anesthesia complications

## 2019-07-19 NOTE — Anesthesia Postprocedure Evaluation (Signed)
Anesthesia Post Note  Patient: Max Ryan  Procedure(s) Performed: CYSTOSCOPY/URETEROSCOPY/HOLMIUM LASER/STENT PLACEMENT (Right Ureter)     Patient location during evaluation: PACU Anesthesia Type: General Level of consciousness: awake and alert Pain management: pain level controlled Vital Signs Assessment: post-procedure vital signs reviewed and stable Respiratory status: spontaneous breathing, nonlabored ventilation, respiratory function stable and patient connected to nasal cannula oxygen Cardiovascular status: blood pressure returned to baseline and stable Postop Assessment: no apparent nausea or vomiting Anesthetic complications: no    Last Vitals:  Vitals:   07/19/19 1806 07/19/19 1825  BP: 139/82 (!) 142/85  Pulse: 78 75  Resp: 16 17  Temp: 36.8 C 36.7 C  SpO2: 95% 96%    Last Pain:  Vitals:   07/19/19 1825  TempSrc:   PainSc: 0-No pain                 Barnet Glasgow

## 2019-07-19 NOTE — Anesthesia Procedure Notes (Signed)
Procedure Name: Intubation Date/Time: 07/19/2019 4:00 PM Performed by: Silas [place], CRNA Pre-anesthesia Checklist: Patient identified, Emergency Drugs available, Suction available and Patient being monitored Patient Re-evaluated:Patient Re-evaluated prior to induction Oxygen Delivery Method: Circle system utilized Preoxygenation: Pre-oxygenation with 100% oxygen Induction Type: IV induction LMA: LMA inserted LMA Size: 4.0 Tube type: Oral Number of attempts: 1 Placement Confirmation: ETT inserted through vocal cords under direct vision,  positive ETCO2 and breath sounds checked- equal and bilateral Tube secured with: Tape Dental Injury: Teeth and Oropharynx as per pre-operative assessment

## 2019-07-19 NOTE — Op Note (Signed)
Preoperative diagnosis: right ureteral calculus  Postoperative diagnosis: right ureteral calculus  Procedure:  1. Cystoscopy 2. right ureteroscopy and stone removal 3. Ureteroscopic laser lithotripsy 4. right 70F x 26 ureteral stent placement  5. right retrograde pyelography with interpretation  Surgeon: Ardis Hughs, MD  Anesthesia: General  Complications: None  Intraoperative findings: Right retrograde pyelography demonstrated a filling defect within the right ureter consistent with the patient's known calculus without other abnormalities. The patient has a known horseshoe kidney and there was mild hydro of the lateral aspect of the collecting system, the medial aspect was of fairly normal caliber.  EBL: Minimal  Specimens: 1. right ureteral calculus  Disposition of specimens: [facility] for stone analysis  Indication: Max Ryan is a 73 y.o.   patient with a right ureteral stone and associated right symptoms. After reviewing the management options for treatment, the patient elected to proceed with the above surgical procedure(s). We have discussed the potential benefits and risks of the procedure, side effects of the proposed treatment, the likelihood of the patient achieving the goals of the procedure, and any potential problems that might occur during the procedure or recuperation. Informed consent has been obtained.   Description of procedure:  The patient was taken to the operating room and general anesthesia was induced.  The patient was placed in the dorsal lithotomy position, prepped and draped in the usual sterile fashion, and preoperative antibiotics were administered. A preoperative time-out was performed.   Cystourethroscopy was performed.  The patient's urethra was examined and was normal and demonstrated bilobar prostatic hypertrophy.  The patient had a high bladder neck.  The bladder was then systematically examined in its entirety. There  was no evidence for any bladder tumors, stones, or other mucosal pathology.    Attention then turned to the right ureteral orifice and a ureteral catheter was used to intubate the ureteral orifice.  Omnipaque contrast was injected through the ureteral catheter and a retrograde pyelogram was performed with findings as dictated above.  A 0.38 sensor guidewire was then advanced up the right ureter into the renal pelvis under fluoroscopic guidance. The 6 Fr semirigid ureteroscope was then advanced into the ureter next to the guidewire and the calculus was identified.   The stone was then fragmented with the 365 micron holmium laser fiber on a setting of 0.6 and frequency of 6 Hz.   All stones were then removed from the ureter with an N-gage nitinol basket.  Reinspection of the ureter revealed no remaining visible stones or fragments.   The wire was then backloaded through the cystoscope and a ureteral stent was advance over the wire using Seldinger technique.  The stent was positioned appropriately under fluoroscopic and cystoscopic guidance.  The wire was then removed with an adequate stent curl noted in the renal pelvis as well as in the bladder.  The bladder was then emptied and the procedure ended.  The patient appeared to tolerate the procedure well and without complications.  The patient was able to be awakened and transferred to the recovery unit in satisfactory condition.   Disposition: The tether of the stent was left on and secured to the ventral aspect of the patient's penis. Instructions for removing the stent have been provided to the patient. The patient has been scheduled for followup in 6 weeks with a renal ultrasound.

## 2019-07-19 NOTE — H&P (Signed)
Acute Kidney Stone  HPI: Max Ryan is a 73 year-old male patient who is here for further eval and management of kidney stones.  The patient presented to [facility] with symptoms of a kidney stone.   His pain started about approximately 07/16/2019. The pain is on the right side.   Abdomen/Pelvic CT: 07/18/19: 33mm distal right ureteral stone, 81mm right UVJ stone. The patient underwent CT scan prior to today's appointment.   The patient relates initially having nausea, flank pain, and groin pain. He is currently having flank pain, groin pain, and nausea. He denies having back pain, vomiting, fever, chills, and voiding symptoms. He has not caught a stone in his urine strainer since his symptoms began.   He has never had surgical treatment for calculi in the past. This is his first kidney stone.   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     ALLERGIES: None   MEDICATIONS: Metformin Hcl 500 mg tablet  Tamsulosin Hcl 0.4 mg capsule  Carbidopa 25 mg tablet  Diclofenac Sodium 50 mg tablet, delayed release  Gabapentin 600 mg tablet  Losartan Potassium 25 mg tablet  Lovastatin 10 mg tablet  Morphine Sulfate 15 mg tablet  Ondansetron Hcl 4 mg tablet  Oxycodone-Acetaminophen 10 mg-325 mg tablet     GU PSH: None   NON-GU PSH: Back Surgery (Unspecified), 2004 Cholecystectomy (open)     GU PMH: None   NON-GU PMH: Arthritis Diabetes Type 2 Gout Hypercholesterolemia Hypertension Sleep Apnea    FAMILY HISTORY: 1 Daughter - Daughter nephrolithiasis - Father   SOCIAL HISTORY: Marital Status: Married Preferred Language: English; Ethnicity: Not Hispanic Or Latino; Race: White Current Smoking Status: Patient does not smoke anymore. Has not smoked since 07/12/2007. Smoked for 12 years. Smoked 1 pack per day.   Tobacco Use Assessment Completed: Used Tobacco in last 30 days? Drinks 2 caffeinated drinks per day. Patient's occupation is/was  Retired.    REVIEW OF SYSTEMS:    GU Review Male:   Patient reports get up at night to urinate, stream starts and stops, and trouble starting your stream. Patient denies frequent urination, hard to postpone urination, burning/ pain with urination, leakage of urine, have to strain to urinate , erection problems, and penile pain.  Gastrointestinal (Upper):   Patient denies nausea, vomiting, and indigestion/ heartburn.  Gastrointestinal (Lower):   Patient denies diarrhea and constipation.  Constitutional:   Patient reports night sweats. Patient denies fever, weight loss, and fatigue.  Skin:   Patient denies skin rash/ lesion and itching.  Eyes:   Patient denies blurred vision and double vision.  Ears/ Nose/ Throat:   Patient denies sore throat and sinus problems.  Hematologic/Lymphatic:   Patient denies swollen glands and easy bruising.  Cardiovascular:   Patient reports leg swelling. Patient denies chest pains.  Respiratory:   Patient denies cough and shortness of breath.  Endocrine:   Patient denies excessive thirst.  Musculoskeletal:   Patient reports back pain and joint pain.   Neurological:   Patient denies headaches and dizziness.  Psychologic:   Patient denies depression and anxiety.   Notes: Erection problems    VITAL SIGNS:      07/19/2019 10:50 AM  Weight 250 lb / 113.4 kg  Height 68 in / 172.72 cm  BP 170/97 mmHg  Pulse 62 /min  Temperature 97.1 F / 36.1 C  BMI 38.0 kg/m   MULTI-SYSTEM PHYSICAL EXAMINATION:    Constitutional: Well-nourished. No physical deformities.  Normally developed. Good grooming.  Neck: Neck symmetrical, not swollen. Normal tracheal position.  Respiratory: Normal breath sounds. No labored breathing, no use of accessory muscles.   Cardiovascular: Regular rate and rhythm. No murmur, no gallop. Normal temperature, normal extremity pulses, no swelling, no varicosities.   Lymphatic: No enlargement of neck, axillae, groin.  Skin: No paleness, no jaundice,  no cyanosis. No lesion, no ulcer, no rash.  Neurologic / Psychiatric: Oriented to time, oriented to place, oriented to person. No depression, no anxiety, no agitation.  Gastrointestinal: No mass, no tenderness, no rigidity, non obese abdomen.  Eyes: Normal conjunctivae. Normal eyelids.  Ears, Nose, Mouth, and Throat: Left ear no scars, no lesions, no masses. Right ear no scars, no lesions, no masses. Nose no scars, no lesions, no masses. Normal hearing. Normal lips.  Musculoskeletal: Normal gait and station of head and neck.     PAST DATA REVIEWED:  Source Of History:  Patient  Records Review:   Previous Patient Records  X-Ray Review: C.T. Abdomen/Pelvis: Reviewed Films. Discussed With Patient.     PROCEDURES:          Urinalysis w/Scope Dipstick Dipstick Cont'd Micro  Color: Amber Bilirubin: Neg mg/dL WBC/hpf: NS (Not Seen)  Appearance: Clear Ketones: Neg mg/dL RBC/hpf: 3 - 10/hpf  Specific Gravity: 1.020 Blood: 1+ ery/uL Bacteria: NS (Not Seen)  pH: <=5.0 Protein: Neg mg/dL Cystals: NS (Not Seen)  Glucose: Neg mg/dL Urobilinogen: 0.2 mg/dL Casts: NS (Not Seen)    Nitrites: Neg Trichomonas: Not Present    Leukocyte Esterase: Neg leu/uL Mucous: Not Present      Epithelial Cells: 0 - 5/hpf      Yeast: NS (Not Seen)      Sperm: Not Present         Ketoralac 60mg  - [id number], [id number] Qty: 60 Adm. By: Louis Meckel, M.D.  Unit: mg Lot No [id number]  Route: IM Exp. Date 01/08/2021  Freq: None Mfgr.:   Site: Left Buttock   ASSESSMENT:      ICD-10 Details  1 GU:   Ureteral calculus - N20.1    PLAN:           Document Letter(s):  Created for Patient: Clinical Summary    I went over the treatment options for their stone. We discussed ongoing medical expulsion therapy, ESWL and ureteroscopy. Ultimately the patient and I agreed that ureterscopy is the best option. I went over this surgery with the patient in detail. The patient understands after being put to sleep, we would proceed with  a telescope to access the stone and potentially use a laser to fragment the stone before removing it with a basket. After removing the stone the patient will require temporary stent placement in the ureter. This is an outpatient procedure. I also discussed the potential of not being able to gain access safely into the ureter/kidney. This would require that a stent be placed and then the patient rescheduled several weeks later for a second attempt. They also understand the small risks of ureteral trauma causing a stricture or permanent damage. I also explained the risk of urinary tract infection. Having gone over the procedure itself, the expected outcome, and the risks/benefits the patient has agreed to proceed.        Notes:   Plan to proceed today for ureteroscopy given patient's poor pain control.

## 2019-07-20 ENCOUNTER — Encounter (HOSPITAL_COMMUNITY): Payer: Self-pay | Admitting: Urology

## 2019-07-26 ENCOUNTER — Other Ambulatory Visit: Payer: Self-pay | Admitting: Family Medicine

## 2019-07-26 DIAGNOSIS — M25512 Pain in left shoulder: Secondary | ICD-10-CM

## 2019-07-30 ENCOUNTER — Ambulatory Visit
Admission: RE | Admit: 2019-07-30 | Discharge: 2019-07-30 | Disposition: A | Discharge status: Home / Self Care | Payer: Medicare Other | Source: Ambulatory Visit | Attending: Family Medicine | Admitting: Family Medicine

## 2019-07-30 ENCOUNTER — Other Ambulatory Visit: Payer: Self-pay

## 2019-07-30 DIAGNOSIS — M25512 Pain in left shoulder: Secondary | ICD-10-CM

## 2019-08-08 ENCOUNTER — Other Ambulatory Visit: Payer: Self-pay | Admitting: Orthopedic Surgery

## 2019-08-11 ENCOUNTER — Encounter (HOSPITAL_COMMUNITY): Payer: Self-pay

## 2019-08-11 ENCOUNTER — Other Ambulatory Visit: Payer: Self-pay

## 2019-08-11 ENCOUNTER — Other Ambulatory Visit (HOSPITAL_COMMUNITY): Payer: Medicare Other

## 2019-08-11 ENCOUNTER — Encounter (HOSPITAL_COMMUNITY)
Admission: RE | Admit: 2019-08-11 | Discharge: 2019-08-11 | Disposition: A | Discharge status: Home / Self Care | Payer: Medicare Other | Source: Ambulatory Visit | Attending: Orthopedic Surgery | Admitting: Orthopedic Surgery

## 2019-08-11 DIAGNOSIS — E119 Type 2 diabetes mellitus without complications: Secondary | ICD-10-CM | POA: Insufficient documentation

## 2019-08-11 DIAGNOSIS — Z01818 Encounter for other preprocedural examination: Secondary | ICD-10-CM | POA: Diagnosis not present

## 2019-08-11 HISTORY — DX: Malignant (primary) neoplasm, unspecified: C80.1

## 2019-08-11 HISTORY — DX: Sarcoidosis, unspecified: D86.9

## 2019-08-11 LAB — CBC WITH DIFFERENTIAL/PLATELET
Abs Immature Granulocytes: 0.04 10*3/uL (ref 0.00–0.07)
Basophils Absolute: 0.1 10*3/uL (ref 0.0–0.1)
Basophils Relative: 1 %
Eosinophils Absolute: 0.5 10*3/uL (ref 0.0–0.5)
Eosinophils Relative: 5 %
HCT: 44.3 % (ref 39.0–52.0)
Hemoglobin: 15.3 g/dL (ref 13.0–17.0)
Immature Granulocytes: 1 %
Lymphocytes Relative: 19 %
Lymphs Abs: 1.6 10*3/uL (ref 0.7–4.0)
MCH: 33.8 pg (ref 26.0–34.0)
MCHC: 34.5 g/dL (ref 30.0–36.0)
MCV: 98 fL (ref 80.0–100.0)
Monocytes Absolute: 0.7 10*3/uL (ref 0.1–1.0)
Monocytes Relative: 8 %
Neutro Abs: 5.8 10*3/uL (ref 1.7–7.7)
Neutrophils Relative %: 66 %
Platelets: 280 10*3/uL (ref 150–400)
RBC: 4.52 MIL/uL (ref 4.22–5.81)
RDW: 11.9 % (ref 11.5–15.5)
WBC: 8.7 10*3/uL (ref 4.0–10.5)
nRBC: 0 % (ref 0.0–0.2)

## 2019-08-11 LAB — URINALYSIS, ROUTINE W REFLEX MICROSCOPIC
Bilirubin Urine: NEGATIVE
Glucose, UA: NEGATIVE mg/dL
Ketones, ur: NEGATIVE mg/dL
Nitrite: POSITIVE — AB
Protein, ur: NEGATIVE mg/dL
Specific Gravity, Urine: 1.012 (ref 1.005–1.030)
WBC, UA: >50 WBC/hpf — ABNORMAL HIGH (ref 0–5)
pH: 5 (ref 5.0–8.0)

## 2019-08-11 LAB — COMPREHENSIVE METABOLIC PANEL
ALT: 33 U/L (ref 0–44)
AST: 43 U/L — ABNORMAL HIGH (ref 15–41)
Albumin: 4.3 g/dL (ref 3.5–5.0)
Alkaline Phosphatase: 50 U/L (ref 38–126)
Anion gap: 10 (ref 5–15)
BUN: 9 mg/dL (ref 8–23)
CO2: 28 mmol/L (ref 22–32)
Calcium: 10.4 mg/dL — ABNORMAL HIGH (ref 8.9–10.3)
Chloride: 100 mmol/L (ref 98–111)
Creatinine, Ser: 0.85 mg/dL (ref 0.61–1.24)
GFR calc Af Amer: >60 mL/min (ref >60)
GFR calc non Af Amer: >60 mL/min (ref >60)
Glucose, Bld: 129 mg/dL — ABNORMAL HIGH (ref 70–99)
Potassium: 4.9 mmol/L (ref 3.5–5.1)
Sodium: 138 mmol/L (ref 135–145)
Total Bilirubin: 1 mg/dL (ref 0.3–1.2)
Total Protein: 7.4 g/dL (ref 6.5–8.1)

## 2019-08-11 LAB — HEMOGLOBIN A1C
Hgb A1c MFr Bld: 7.4 % — ABNORMAL HIGH (ref 4.8–5.6)
Mean Plasma Glucose: 165.68 mg/dL

## 2019-08-11 LAB — TYPE AND SCREEN
ABO/RH(D): A POS
Antibody Screen: NEGATIVE

## 2019-08-11 LAB — PROTIME-INR
INR: 1.2 (ref 0.8–1.2)
Prothrombin Time: 14.7 seconds (ref 11.4–15.2)

## 2019-08-11 LAB — ABO/RH: ABO/RH(D): A POS

## 2019-08-11 LAB — APTT: aPTT: 29 seconds (ref 24–36)

## 2019-08-11 LAB — GLUCOSE, CAPILLARY: Glucose-Capillary: 170 mg/dL — ABNORMAL HIGH (ref 70–99)

## 2019-08-11 LAB — SURGICAL PCR SCREEN
MRSA, PCR: NEGATIVE
Staphylococcus aureus: NEGATIVE

## 2019-08-11 NOTE — Progress Notes (Signed)
Spoke with Butch Penny at Dr. Laurena Bering office regarding positive U/A.  She is forwarding to the PA.

## 2019-08-11 NOTE — Progress Notes (Addendum)
PCP:  Dr. Alfonse Spruce, [place], [place] Cardiologist: denies  EKG:  08/11/19 CXR:  denies ECHO:  denies Stress Test: Patient thinks he had a chemical stress test approx 4 years ago.  Medical records requested from PCP Cardiac Cath:  denies  Fasting Blood Sugar- Patient does not have a meter and therefore does not check CBG.  A1C drawn. Checks Blood Sugar__0_ times a day  Blood Thinners: N/A ASA Instructions:  N/A  Anesthesia Review:  Yes, Sarcoidosis, sleep Apnea Score 6, A1C today 7.4, ETOH abuse, abnormal EKG, U/A + Nitrite/Bacteria. Tawanna Solo at [facility] office with results.  Patient denies shortness of breath, fever, cough, and chest pain at PAT appointment.  Patient verbalized understanding of instructions provided today at the PAT appointment.  Patient asked to review instructions at home and day of surgery.

## 2019-08-11 NOTE — Progress Notes (Addendum)
[id number] [address], [place] [address] Phone: [phone] Fax: [phone]  [id number] - [place], [place] [id number] [place] [id number] Phone: [phone] Fax: [phone]      Your procedure is scheduled on August 17, 2019.  Report to [facility] Main Entrance "A" at 10:10 A.M., and check in at the Admitting office.  Call this number if you have problems the morning of surgery:  [phone]  Call [phone] if you have any questions prior to your surgery date Monday-Friday 8am-4pm    Remember:  Do not eat after midnight the night before your surgery  You may drink clear liquids until 10:00 A.M. the morning of your surgery.   Clear liquids allowed are: Water, Non-Citrus Juices (without pulp), Carbonated Beverages, Clear Tea, Black Coffee Only, and Gatorade  Please complete your PRE-SURGERY Gatorade that was provided to you by 10:00 A.M. the morning of surgery.  Please, if able, drink it in one setting. DO NOT SIP.    Take these medicines the morning of surgery with A SIP OF WATER: Carbidopa gabapentin (NEURONTIN) lovastatin (MEVACOR) ondansetron (ZOFRAN ODT) - if needed oxyCODONE-acetaminophen (PERCOCET/ROXICET)- if needed phenazopyridine (PYRIDIUM)- if needed traMADol (ULTRAM- if needed  7 days prior to surgery, stop taking all Aspirin (unless instructed by your doctor) and Other Aspirin containing products, Vitamins, Fish oils, and Herbal medications.  Also stop all NSAIDS i.e. Advil, Ibuprofen, Motrin, Aleve, Anaprox, Naproxen, BC, Goody Powders, and all Supplements.    WHAT DO I DO ABOUT MY DIABETES MEDICATION?   Marland Kitchen Do not take oral diabetes medicines (pills) the morning of surgery.  How to Manage Your Diabetes Before and After Surgery  Why is it important to control my blood sugar before and after surgery? . Improving  blood sugar levels before and after surgery helps healing and can limit problems. . A way of improving blood sugar control is eating a healthy diet by: o  Eating less sugar and carbohydrates o  Increasing activity/exercise o  Talking with your doctor about reaching your blood sugar goals . High blood sugars (greater than 180 mg/dL) can raise your risk of infections and slow your recovery, so you will need to focus on controlling your diabetes during the weeks before surgery. . Make sure that the doctor who takes care of your diabetes knows about your planned surgery including the date and location.  How do I manage my blood sugar before surgery? . Check your blood sugar at least 4 times a day, starting 2 days before surgery, to make sure that the level is not too high or low. o Check your blood sugar the morning of your surgery when you wake up and every 2 hours until you get to the Short Stay unit. . If your blood sugar is less than 70 mg/dL, you will need to treat for low blood sugar: o Do not take insulin. o Treat a low blood sugar (less than 70 mg/dL) with  cup of clear juice (cranberry or apple), 4 glucose tablets, OR glucose gel. o Recheck blood sugar in 15 minutes after treatment (to make sure it is greater than 70 mg/dL). If your blood sugar is not greater than 70 mg/dL on recheck, call [phone] for further instructions. . Report your blood sugar to the short stay nurse when you get to Short Stay.  . If you are admitted to the hospital after  surgery: o Your blood sugar will be checked by the staff and you will probably be given insulin after surgery (instead of oral diabetes medicines) to make sure you have good blood sugar levels. o The goal for blood sugar control after surgery is 80-180 mg/dL.     The Morning of Surgery  Do not wear jewelry, make-up or nail polish.  Do not wear lotions, powders, or perfumes/colognes, or deodorant  Do not shave 48 hours prior to surgery.  Men  may shave face and neck.  Do not bring valuables to the hospital.  [facility] is not responsible for any belongings or valuables.  If you are a smoker, DO NOT Smoke 24 hours prior to surgery IF you wear a CPAP at night please bring your mask, tubing, and machine the morning of surgery   Remember that you must have someone to transport you home after your surgery, and remain with you for 24 hours if you are discharged the same day.   Contacts, glasses, hearing aids, dentures or bridgework may not be worn into surgery.    Leave your suitcase in the car.  After surgery it may be brought to your room.  For patients admitted to the hospital, discharge time will be determined by your treatment team.  Patients discharged the day of surgery will not be allowed to drive home.    Special instructions:   [place]- Preparing For Surgery  Before surgery, you can play an important role. Because skin is not sterile, your skin needs to be as free of germs as possible. You can reduce the number of germs on your skin by washing with CHG (chlorahexidine gluconate) Soap before surgery.  CHG is an antiseptic cleaner which kills germs and bonds with the skin to continue killing germs even after washing.    Oral Hygiene is also important to reduce your risk of infection.  Remember - BRUSH YOUR TEETH THE MORNING OF SURGERY WITH YOUR REGULAR TOOTHPASTE  Please do not use if you have an allergy to CHG or antibacterial soaps. If your skin becomes reddened/irritated stop using the CHG.  Do not shave (including legs and underarms) for at least 48 hours prior to first CHG shower. It is OK to shave your face.  Please follow these instructions carefully.   1. Shower the NIGHT BEFORE SURGERY and the MORNING OF SURGERY with CHG Soap.   2. If you chose to wash your hair, wash your hair first as usual with your normal shampoo.  3. After you shampoo, rinse your hair and body thoroughly to remove the  shampoo.  4. Use CHG as you would any other liquid soap. You can apply CHG directly to the skin and wash gently with a scrungie or a clean washcloth.   5. Apply the CHG Soap to your body ONLY FROM THE NECK DOWN.  Do not use on open wounds or open sores. Avoid contact with your eyes, ears, mouth and genitals (private parts). Wash Face and genitals (private parts)  with your normal soap.   6. Wash thoroughly, paying special attention to the area where your surgery will be performed.  7. Thoroughly rinse your body with warm water from the neck down.  8. DO NOT shower/wash with your normal soap after using and rinsing off the CHG Soap.  9. Pat yourself dry with a CLEAN TOWEL.  10. Wear CLEAN PAJAMAS to bed the night before surgery, wear comfortable clothes the morning of surgery  11. Place CLEAN  SHEETS on your bed the night of your first shower and DO NOT SLEEP WITH PETS.    Day of Surgery:  Do not apply any deodorants/lotions. Please shower the morning of surgery with the CHG soap  Please wear clean clothes to the hospital/surgery center.   Remember to brush your teeth WITH YOUR REGULAR TOOTHPASTE.   Please read over the following fact sheets that you were given.

## 2019-08-12 NOTE — Anesthesia Preprocedure Evaluation (Addendum)
Anesthesia Evaluation  Patient identified by MRN, date of birth, ID band Patient awake    Reviewed: Allergy & Precautions, NPO status , Patient's Chart, lab work & pertinent test results  Airway Mallampati: III  TM Distance: >3 FB   Mouth opening: Limited Mouth Opening  Dental  (+) Teeth Intact, Dental Advisory Given   Pulmonary former smoker,  08/15/2019 SARS coronavirus neg H/o sarcoid   breath sounds clear to auscultation       Cardiovascular hypertension, Pt. on medications  Rhythm:Regular Rate:Normal     Neuro/Psych Parkinson's    GI/Hepatic S/p gastric bypass   Endo/Other  diabetes, Oral Hypoglycemic Agents  Renal/GU      Musculoskeletal   Abdominal (+) + obese,   Peds  Hematology   Anesthesia Other Findings   Reproductive/Obstetrics                           Anesthesia Physical Anesthesia Plan  ASA: III  Anesthesia Plan: General   Post-op Pain Management:    Induction: Intravenous  PONV Risk Score and Plan: Ondansetron  Airway Management Planned: Oral ETT  Additional Equipment:   Intra-op Plan:   Post-operative Plan: Extubation in OR  Informed Consent: I have reviewed the patients History and Physical, chart, labs and discussed the procedure including the risks, benefits and alternatives for the proposed anesthesia with the patient or authorized representative who has indicated his/her understanding and acceptance.     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 Says he has never been on any specific treatment for it, has never had any pulmonary or other issues related to it. Reports he has had at least 3 CT scans showing no progression and he was told it is unlikely it will progress. He does not currently have a PCP in [place], spends time in [place] and [place] and  does not have local primary care. Records reviewed from multiple providers in care everywhere and the only mention of sarcoidosis is in 2007 when he had normal PFTs and an echo that showed mildly elevated right heart pressure but was otherwise normal. CXR from 2018 is also normal.   CXR 06/29/17 (care everywhere): FINDINGS: The lungs are not optimally expanded and no focal pulmonary  opacity. No pleural effusion, no pneumothorax. Cardiomediastinal  silhouette is unremarkable.  TTE 2007 (care everywhere): Conclusions:  1)The Right Ventricular Systolic Pressure is calculated at 38 mmHG. 2)The left ventricular chamber size is slightly dilated. 3)Estimated left ventricular ejection fraction is 60-65%. 4)Grade I left ventricular diastolic dysfunction. 5)The right ventricle is slightly enlarged. 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   Spirometry 2007 (care everywhere):  FVC of 3.49 liters, which is 83% of predicted. FEV1 was 2.90 liters, which is 95% of predicted.  FEV1/FVC ratio was 83%.  MVV was normal. LUNG VOLUMES: Total lung capacity of 5.91 liters, which is 99% of predicted. The rest of the lung volumes were normal. DIFFUSING CAPACITY: DLco of 22.7, which is 83% of predicted.  INTERPRETATION: This is essentially a normal pulmonary function study.     )   Anesthesia Quick Evaluation

## 2019-08-15 ENCOUNTER — Other Ambulatory Visit (HOSPITAL_COMMUNITY)
Admission: RE | Admit: 2019-08-15 | Discharge: 2019-08-15 | Disposition: A | Discharge status: Home / Self Care | Payer: Medicare (Managed Care) | Source: Ambulatory Visit | Attending: Orthopedic Surgery | Admitting: Orthopedic Surgery

## 2019-08-15 LAB — SARS CORONAVIRUS 2 (TAT 6-24 HRS): SARS Coronavirus 2: NEGATIVE

## 2019-08-17 ENCOUNTER — Inpatient Hospital Stay (HOSPITAL_COMMUNITY): Payer: Medicare (Managed Care) | Admitting: Physician Assistant

## 2019-08-17 ENCOUNTER — Other Ambulatory Visit: Payer: Self-pay

## 2019-08-17 ENCOUNTER — Encounter (HOSPITAL_COMMUNITY): Payer: Self-pay | Admitting: Surgery

## 2019-08-17 ENCOUNTER — Inpatient Hospital Stay (HOSPITAL_COMMUNITY): Payer: Medicare (Managed Care)

## 2019-08-17 ENCOUNTER — Encounter (HOSPITAL_COMMUNITY)
Admission: RE | Disposition: A | Discharge status: Home / Self Care | Payer: Self-pay | Source: Home / Self Care | Attending: Orthopedic Surgery

## 2019-08-17 ENCOUNTER — Inpatient Hospital Stay (HOSPITAL_COMMUNITY)
Admission: RE | Admit: 2019-08-17 | Discharge: 2019-08-18 | DRG: 472 | Disposition: A | Discharge status: Home / Self Care | Payer: Medicare (Managed Care) | Attending: Orthopedic Surgery | Admitting: Orthopedic Surgery

## 2019-08-17 DIAGNOSIS — E1141 Type 2 diabetes mellitus with diabetic mononeuropathy: Secondary | ICD-10-CM | POA: Diagnosis present

## 2019-08-17 DIAGNOSIS — Z20828 Contact with and (suspected) exposure to other viral communicable diseases: Secondary | ICD-10-CM | POA: Diagnosis present

## 2019-08-17 DIAGNOSIS — M2578 Osteophyte, vertebrae: Secondary | ICD-10-CM | POA: Diagnosis present

## 2019-08-17 DIAGNOSIS — G9589 Other specified diseases of spinal cord: Secondary | ICD-10-CM | POA: Diagnosis present

## 2019-08-17 DIAGNOSIS — M5011 Cervical disc disorder with radiculopathy,  high cervical region: Principal | ICD-10-CM | POA: Diagnosis present

## 2019-08-17 DIAGNOSIS — M4802 Spinal stenosis, cervical region: Secondary | ICD-10-CM | POA: Diagnosis present

## 2019-08-17 DIAGNOSIS — Z7984 Long term (current) use of oral hypoglycemic drugs: Secondary | ICD-10-CM

## 2019-08-17 DIAGNOSIS — I10 Essential (primary) hypertension: Secondary | ICD-10-CM | POA: Diagnosis present

## 2019-08-17 DIAGNOSIS — M5001 Cervical disc disorder with myelopathy,  high cervical region: Secondary | ICD-10-CM | POA: Diagnosis present

## 2019-08-17 DIAGNOSIS — Z79899 Other long term (current) drug therapy: Secondary | ICD-10-CM

## 2019-08-17 DIAGNOSIS — Z9884 Bariatric surgery status: Secondary | ICD-10-CM

## 2019-08-17 DIAGNOSIS — Z87891 Personal history of nicotine dependence: Secondary | ICD-10-CM

## 2019-08-17 DIAGNOSIS — G549 Nerve root and plexus disorder, unspecified: Secondary | ICD-10-CM | POA: Diagnosis present

## 2019-08-17 DIAGNOSIS — Z419 Encounter for procedure for purposes other than remedying health state, unspecified: Secondary | ICD-10-CM

## 2019-08-17 DIAGNOSIS — M4602 Spinal enthesopathy, cervical region: Secondary | ICD-10-CM | POA: Diagnosis present

## 2019-08-17 DIAGNOSIS — Z96652 Presence of left artificial knee joint: Secondary | ICD-10-CM | POA: Diagnosis present

## 2019-08-17 HISTORY — PX: ANTERIOR CERVICAL DECOMPRESSION/DISCECTOMY FUSION 4 LEVELS: SHX5556

## 2019-08-17 LAB — GLUCOSE, CAPILLARY
Glucose-Capillary: 152 mg/dL — ABNORMAL HIGH (ref 70–99)
Glucose-Capillary: 147 mg/dL — ABNORMAL HIGH (ref 70–99)
Glucose-Capillary: 195 mg/dL — ABNORMAL HIGH (ref 70–99)
Glucose-Capillary: 239 mg/dL — ABNORMAL HIGH (ref 70–99)

## 2019-08-17 SURGERY — ANTERIOR CERVICAL DECOMPRESSION/DISCECTOMY FUSION 4 LEVELS
Anesthesia: General | Site: Spine Cervical

## 2019-08-17 MED ORDER — GABAPENTIN 600 MG PO TABS
600.0000 mg | ORAL_TABLET | Freq: Two times a day (BID) | ORAL | Status: DC
  Administered 2019-08-17 – 2019-08-18 (×2): 600 mg via ORAL
  Filled 2019-08-17 (×2): qty 1

## 2019-08-17 MED ORDER — THROMBIN 20000 UNITS EX SOLR
CUTANEOUS | Status: AC
  Filled 2019-08-17: qty 20000

## 2019-08-17 MED ORDER — LIDOCAINE 2% (20 MG/ML) 5 ML SYRINGE
INTRAMUSCULAR | Status: AC
  Filled 2019-08-17: qty 5

## 2019-08-17 MED ORDER — CEFAZOLIN SODIUM 1 G IJ SOLR
INTRAMUSCULAR | Status: AC
  Filled 2019-08-17: qty 20

## 2019-08-17 MED ORDER — MIDAZOLAM HCL 2 MG/2ML IJ SOLN
INTRAMUSCULAR | Status: DC | PRN
  Administered 2019-08-17: 2 mg via INTRAVENOUS

## 2019-08-17 MED ORDER — SODIUM CHLORIDE 0.9% FLUSH: 3.0000 mL | Freq: Two times a day (BID) | INTRAVENOUS | Status: DC

## 2019-08-17 MED ORDER — SODIUM CHLORIDE 0.9% FLUSH: 3.0000 mL | INTRAVENOUS | Status: DC | PRN

## 2019-08-17 MED ORDER — THROMBIN 20000 UNITS EX SOLR
CUTANEOUS | Status: DC | PRN
  Administered 2019-08-17: 13:00:00 20 mL via TOPICAL

## 2019-08-17 MED ORDER — DEXAMETHASONE SODIUM PHOSPHATE 10 MG/ML IJ SOLN
INTRAMUSCULAR | Status: DC | PRN
  Administered 2019-08-17: 10 mg via INTRAVENOUS

## 2019-08-17 MED ORDER — ALUM & MAG HYDROXIDE-SIMETH 200-200-20 MG/5ML PO SUSP: 30.0000 mL | Freq: Four times a day (QID) | ORAL | Status: DC | PRN

## 2019-08-17 MED ORDER — FENTANYL CITRATE (PF) 250 MCG/5ML IJ SOLN
INTRAMUSCULAR | Status: AC
  Filled 2019-08-17: qty 5

## 2019-08-17 MED ORDER — ONDANSETRON HCL 4 MG PO TABS: 4.0000 mg | ORAL_TABLET | Freq: Four times a day (QID) | ORAL | Status: DC | PRN

## 2019-08-17 MED ORDER — DOCUSATE SODIUM 100 MG PO CAPS
100.0000 mg | ORAL_CAPSULE | Freq: Two times a day (BID) | ORAL | Status: DC
  Administered 2019-08-17 – 2019-08-18 (×2): 100 mg via ORAL
  Filled 2019-08-17 (×2): qty 1

## 2019-08-17 MED ORDER — OXYCODONE-ACETAMINOPHEN 5-325 MG PO TABS
1.0000 | ORAL_TABLET | ORAL | Status: DC | PRN
  Administered 2019-08-17 – 2019-08-18 (×4): 2 via ORAL
  Filled 2019-08-17 (×4): qty 2

## 2019-08-17 MED ORDER — FENTANYL CITRATE (PF) 100 MCG/2ML IJ SOLN
INTRAMUSCULAR | Status: AC
  Administered 2019-08-17: 18:00:00 25 ug via INTRAVENOUS
  Filled 2019-08-17: qty 2

## 2019-08-17 MED ORDER — EPHEDRINE 5 MG/ML INJ
INTRAVENOUS | Status: AC
  Filled 2019-08-17: qty 10

## 2019-08-17 MED ORDER — EPHEDRINE SULFATE-NACL 50-0.9 MG/10ML-% IV SOSY
PREFILLED_SYRINGE | INTRAVENOUS | Status: DC | PRN
  Administered 2019-08-17 (×5): 10 mg via INTRAVENOUS
  Administered 2019-08-17 (×2): 5 mg via INTRAVENOUS

## 2019-08-17 MED ORDER — ROCURONIUM BROMIDE 10 MG/ML (PF) SYRINGE
PREFILLED_SYRINGE | INTRAVENOUS | Status: AC
  Filled 2019-08-17: qty 10

## 2019-08-17 MED ORDER — LACTATED RINGERS IV SOLN
INTRAVENOUS | Status: DC | PRN
  Administered 2019-08-17 (×2): via INTRAVENOUS

## 2019-08-17 MED ORDER — CEFAZOLIN SODIUM-DEXTROSE 2-4 GM/100ML-% IV SOLN
2.0000 g | INTRAVENOUS | Status: AC
  Administered 2019-08-17 (×2): 2 g via INTRAVENOUS
  Filled 2019-08-17: qty 100

## 2019-08-17 MED ORDER — MENTHOL 3 MG MT LOZG: 1.0000 | LOZENGE | OROMUCOSAL | Status: DC | PRN

## 2019-08-17 MED ORDER — ONDANSETRON HCL 4 MG/2ML IJ SOLN
INTRAMUSCULAR | Status: DC | PRN
  Administered 2019-08-17: 4 mg via INTRAVENOUS

## 2019-08-17 MED ORDER — BISACODYL 5 MG PO TBEC: 5.0000 mg | DELAYED_RELEASE_TABLET | Freq: Every day | ORAL | Status: DC | PRN

## 2019-08-17 MED ORDER — OXYCODONE HCL 5 MG/5ML PO SOLN: 5.0000 mg | Freq: Once | ORAL | Status: DC | PRN

## 2019-08-17 MED ORDER — ACETAMINOPHEN 325 MG PO TABS: 650.0000 mg | ORAL_TABLET | ORAL | Status: DC | PRN

## 2019-08-17 MED ORDER — CARBIDOPA-LEVODOPA 25-100 MG PO TABS
1.0000 | ORAL_TABLET | Freq: Three times a day (TID) | ORAL | Status: DC
  Administered 2019-08-17: 1 via ORAL
  Filled 2019-08-17 (×3): qty 1

## 2019-08-17 MED ORDER — DIAZEPAM 5 MG PO TABS
5.0000 mg | ORAL_TABLET | Freq: Four times a day (QID) | ORAL | Status: DC | PRN
  Administered 2019-08-17 – 2019-08-18 (×2): 5 mg via ORAL
  Filled 2019-08-17 (×2): qty 1

## 2019-08-17 MED ORDER — PANTOPRAZOLE SODIUM 40 MG IV SOLR
40.0000 mg | Freq: Every day | INTRAVENOUS | Status: DC
  Administered 2019-08-17: 20:00:00 40 mg via INTRAVENOUS
  Filled 2019-08-17: qty 40

## 2019-08-17 MED ORDER — ONDANSETRON HCL 4 MG/2ML IJ SOLN
INTRAMUSCULAR | Status: AC
  Filled 2019-08-17: qty 2

## 2019-08-17 MED ORDER — PROPOFOL 10 MG/ML IV BOLUS
INTRAVENOUS | Status: DC | PRN
  Administered 2019-08-17: 180 mg via INTRAVENOUS

## 2019-08-17 MED ORDER — BUPIVACAINE-EPINEPHRINE 0.25% -1:200000 IJ SOLN
INTRAMUSCULAR | Status: DC | PRN
  Administered 2019-08-17: 8 mL

## 2019-08-17 MED ORDER — ONDANSETRON HCL 4 MG/2ML IJ SOLN: 4.0000 mg | Freq: Four times a day (QID) | INTRAMUSCULAR | Status: DC | PRN

## 2019-08-17 MED ORDER — CEFAZOLIN SODIUM-DEXTROSE 2-4 GM/100ML-% IV SOLN
2.0000 g | Freq: Three times a day (TID) | INTRAVENOUS | Status: AC
  Administered 2019-08-18 (×2): 2 g via INTRAVENOUS
  Filled 2019-08-17 (×2): qty 100

## 2019-08-17 MED ORDER — FLEET ENEMA 7-19 GM/118ML RE ENEM: 1.0000 | ENEMA | Freq: Once | RECTAL | Status: DC | PRN

## 2019-08-17 MED ORDER — FENTANYL CITRATE (PF) 250 MCG/5ML IJ SOLN
INTRAMUSCULAR | Status: DC | PRN
  Administered 2019-08-17: 50 ug via INTRAVENOUS
  Administered 2019-08-17: 75 ug via INTRAVENOUS
  Administered 2019-08-17: 25 ug via INTRAVENOUS
  Administered 2019-08-17: 100 ug via INTRAVENOUS

## 2019-08-17 MED ORDER — PHENOL 1.4 % MT LIQD: 1.0000 | OROMUCOSAL | Status: DC | PRN

## 2019-08-17 MED ORDER — SENNOSIDES-DOCUSATE SODIUM 8.6-50 MG PO TABS: 1.0000 | ORAL_TABLET | Freq: Every evening | ORAL | Status: DC | PRN

## 2019-08-17 MED ORDER — PHENYLEPHRINE HCL (PRESSORS) 10 MG/ML IV SOLN
INTRAVENOUS | Status: AC
  Filled 2019-08-17: qty 2

## 2019-08-17 MED ORDER — ONDANSETRON HCL 4 MG/2ML IJ SOLN: 4.0000 mg | Freq: Once | INTRAMUSCULAR | Status: DC | PRN

## 2019-08-17 MED ORDER — PRAVASTATIN SODIUM 10 MG PO TABS: 10.0000 mg | ORAL_TABLET | Freq: Every day | ORAL | Status: DC

## 2019-08-17 MED ORDER — OXYCODONE HCL 5 MG PO TABS: 5.0000 mg | ORAL_TABLET | Freq: Once | ORAL | Status: DC | PRN

## 2019-08-17 MED ORDER — 0.9 % SODIUM CHLORIDE (POUR BTL) OPTIME
TOPICAL | Status: DC | PRN
  Administered 2019-08-17: 1000 mL

## 2019-08-17 MED ORDER — PROPOFOL 10 MG/ML IV BOLUS
INTRAVENOUS | Status: AC
  Filled 2019-08-17: qty 20

## 2019-08-17 MED ORDER — SODIUM CHLORIDE 0.9 % IV SOLN
INTRAVENOUS | Status: DC | PRN
  Administered 2019-08-17: 45 ug/min via INTRAVENOUS

## 2019-08-17 MED ORDER — PHENYLEPHRINE 40 MCG/ML (10ML) SYRINGE FOR IV PUSH (FOR BLOOD PRESSURE SUPPORT)
PREFILLED_SYRINGE | INTRAVENOUS | Status: AC
  Filled 2019-08-17: qty 10

## 2019-08-17 MED ORDER — METFORMIN HCL 500 MG PO TABS
500.0000 mg | ORAL_TABLET | Freq: Two times a day (BID) | ORAL | Status: DC
  Administered 2019-08-18: 07:00:00 500 mg via ORAL
  Filled 2019-08-17 (×2): qty 1

## 2019-08-17 MED ORDER — ACETAMINOPHEN 650 MG RE SUPP: 650.0000 mg | RECTAL | Status: DC | PRN

## 2019-08-17 MED ORDER — POVIDONE-IODINE 7.5 % EX SOLN: Freq: Once | CUTANEOUS | Status: DC

## 2019-08-17 MED ORDER — BUPIVACAINE-EPINEPHRINE (PF) 0.25% -1:200000 IJ SOLN
INTRAMUSCULAR | Status: AC
  Filled 2019-08-17: qty 30

## 2019-08-17 MED ORDER — FENTANYL CITRATE (PF) 100 MCG/2ML IJ SOLN
25.0000 ug | INTRAMUSCULAR | Status: DC | PRN
  Administered 2019-08-17 (×2): 25 ug via INTRAVENOUS

## 2019-08-17 MED ORDER — ROCURONIUM BROMIDE 10 MG/ML (PF) SYRINGE
PREFILLED_SYRINGE | INTRAVENOUS | Status: DC | PRN
  Administered 2019-08-17: 10 mg via INTRAVENOUS
  Administered 2019-08-17: 20 mg via INTRAVENOUS
  Administered 2019-08-17: 40 mg via INTRAVENOUS
  Administered 2019-08-17: 10 mg via INTRAVENOUS
  Administered 2019-08-17: 20 mg via INTRAVENOUS
  Administered 2019-08-17: 60 mg via INTRAVENOUS

## 2019-08-17 MED ORDER — LOSARTAN POTASSIUM 50 MG PO TABS
25.0000 mg | ORAL_TABLET | Freq: Every day | ORAL | Status: DC
  Administered 2019-08-18: 09:00:00 25 mg via ORAL
  Filled 2019-08-17 (×2): qty 1

## 2019-08-17 MED ORDER — MIDAZOLAM HCL 2 MG/2ML IJ SOLN
INTRAMUSCULAR | Status: AC
  Filled 2019-08-17: qty 2

## 2019-08-17 MED ORDER — LIDOCAINE 2% (20 MG/ML) 5 ML SYRINGE
INTRAMUSCULAR | Status: DC | PRN
  Administered 2019-08-17: 60 mg via INTRAVENOUS

## 2019-08-17 MED ORDER — CARBIDOPA-LEVODOPA 25-100 MG PO TABS: 1.0000 | ORAL_TABLET | Freq: Every day | ORAL | Status: DC

## 2019-08-17 MED ORDER — SUGAMMADEX SODIUM 200 MG/2ML IV SOLN
INTRAVENOUS | Status: DC | PRN
  Administered 2019-08-17: 250 mg via INTRAVENOUS

## 2019-08-17 MED ORDER — DEXAMETHASONE SODIUM PHOSPHATE 10 MG/ML IJ SOLN
INTRAMUSCULAR | Status: AC
  Filled 2019-08-17: qty 1

## 2019-08-17 MED ORDER — SODIUM CHLORIDE 0.9 % IV SOLN: 250.0000 mL | INTRAVENOUS | Status: DC

## 2019-08-17 SURGICAL SUPPLY — 71 items
BENZOIN TINCTURE PRP APPL 2/3 (GAUZE/BANDAGES/DRESSINGS) ×3 IMPLANT
BIT DRILL NEURO 2X3.1 SFT TUCH (MISCELLANEOUS) ×1 IMPLANT
BIT DRILL SRG 14X2.2XFLT CHK (BIT) ×1 IMPLANT
BIT DRL SRG 14X2.2XFLT CHK (BIT) ×1
BLADE CLIPPER SURG (BLADE) ×3 IMPLANT
BLADE SURG 15 STRL LF DISP TIS (BLADE) ×1 IMPLANT
BLADE SURG 15 STRL SS (BLADE) ×2
BONE VIVIGEN FORMABLE 5.4CC (Bone Implant) ×3 IMPLANT
BUR MATCHSTICK NEURO 3.0 LAGG (BURR) ×3 IMPLANT
CARTRIDGE OIL MAESTRO DRILL (MISCELLANEOUS) ×1 IMPLANT
CERVICAL PARALLEL MED 7MM (Bone Implant) ×3 IMPLANT
CLOSURE WOUND 1/2 X4 (GAUZE/BANDAGES/DRESSINGS) ×1
COVER MAYO STAND STRL (DRAPES) ×6 IMPLANT
DEVICE ENDSKLTN MED 6 7MM (Orthopedic Implant) ×3 IMPLANT
DIFFUSER DRILL AIR PNEUMATIC (MISCELLANEOUS) ×3 IMPLANT
DRAPE C-ARM 42X72 X-RAY (DRAPES) ×3 IMPLANT
DRAPE POUCH INSTRU U-SHP 10X18 (DRAPES) ×3 IMPLANT
DRAPE SURG 17X23 STRL (DRAPES) ×9 IMPLANT
DRILL BIT SKYLINE 14MM (BIT) ×2
DRILL NEURO 2X3.1 SOFT TOUCH (MISCELLANEOUS) ×3
DURAPREP 26ML APPLICATOR (WOUND CARE) ×3 IMPLANT
ELECT COATED BLADE 2.86 ST (ELECTRODE) ×3 IMPLANT
ELECT REM PT RETURN 9FT ADLT (ELECTROSURGICAL) ×3
ELECTRODE REM PT RTRN 9FT ADLT (ELECTROSURGICAL) ×1 IMPLANT
ENDOSKELETON MED 6 7MM (Orthopedic Implant) ×9 IMPLANT
GAUZE 4X4 16PLY RFD (DISPOSABLE) ×3 IMPLANT
GAUZE SPONGE 4X4 12PLY STRL (GAUZE/BANDAGES/DRESSINGS) ×3 IMPLANT
GLOVE BIO SURGEON STRL SZ7 (GLOVE) ×3 IMPLANT
GLOVE BIO SURGEON STRL SZ8 (GLOVE) ×3 IMPLANT
GLOVE BIOGEL PI IND STRL 7.0 (GLOVE) ×5 IMPLANT
GLOVE BIOGEL PI IND STRL 7.5 (GLOVE) ×3 IMPLANT
GLOVE BIOGEL PI IND STRL 8 (GLOVE) ×1 IMPLANT
GLOVE BIOGEL PI INDICATOR 7.0 (GLOVE) ×10
GLOVE BIOGEL PI INDICATOR 7.5 (GLOVE) ×6
GLOVE BIOGEL PI INDICATOR 8 (GLOVE) ×2
GLOVE SS N UNI LF 6.5 STRL (GLOVE) ×6 IMPLANT
GLOVE SS N UNI LF 7.0 STRL (GLOVE) ×6 IMPLANT
GOWN STRL REUS W/ TWL LRG LVL3 (GOWN DISPOSABLE) ×3 IMPLANT
GOWN STRL REUS W/ TWL XL LVL3 (GOWN DISPOSABLE) ×3 IMPLANT
GOWN STRL REUS W/TWL LRG LVL3 (GOWN DISPOSABLE) ×6
GOWN STRL REUS W/TWL XL LVL3 (GOWN DISPOSABLE) ×6
INTERLOCK LRDTC CRVCL VBR 7MM (Bone Implant) ×1 IMPLANT
IV CATH 14GX2 1/4 (CATHETERS) ×3 IMPLANT
KIT BASIN OR (CUSTOM PROCEDURE TRAY) ×3 IMPLANT
KIT TURNOVER KIT B (KITS) ×3 IMPLANT
LORDOTIC CERVICAL VBR 7MM SM (Bone Implant) ×3 IMPLANT
NEEDLE PRECISIONGLIDE 27X1.5 (NEEDLE) ×3 IMPLANT
NEEDLE SPNL 20GX3.5 QUINCKE YW (NEEDLE) ×3 IMPLANT
NS IRRIG 1000ML POUR BTL (IV SOLUTION) ×3 IMPLANT
OIL CARTRIDGE MAESTRO DRILL (MISCELLANEOUS) ×3
PACK ORTHO CERVICAL (CUSTOM PROCEDURE TRAY) ×3 IMPLANT
PATTIES SURGICAL .5 X.5 (GAUZE/BANDAGES/DRESSINGS) IMPLANT
PATTIES SURGICAL .5 X1 (DISPOSABLE) IMPLANT
PIN DISTRACTION 14 (PIN) ×6 IMPLANT
PLATE SKYLINE 72MM SPINAL (Plate) ×3 IMPLANT
POSITIONER HEAD DONUT 9IN (MISCELLANEOUS) ×3 IMPLANT
SCREW SKYLINE VAR OS 14MM (Screw) ×30 IMPLANT
SPONGE INTESTINAL PEANUT (DISPOSABLE) ×6 IMPLANT
SPONGE SURGIFOAM ABS GEL 100 (HEMOSTASIS) ×3 IMPLANT
STRIP CLOSURE SKIN 1/2X4 (GAUZE/BANDAGES/DRESSINGS) ×2 IMPLANT
SUT MNCRL AB 4-0 PS2 18 (SUTURE) ×3 IMPLANT
SUT VIC AB 2-0 CT2 18 VCP726D (SUTURE) ×3 IMPLANT
SYR BULB IRRIGATION 50ML (SYRINGE) ×3 IMPLANT
SYR CONTROL 10ML LL (SYRINGE) ×6 IMPLANT
TAPE CLOTH SURG 4X10 WHT LF (GAUZE/BANDAGES/DRESSINGS) ×3 IMPLANT
TAPE UMBILICAL COTTON 1/8X30 (MISCELLANEOUS) ×3 IMPLANT
TOWEL GREEN STERILE (TOWEL DISPOSABLE) ×3 IMPLANT
TOWEL GREEN STERILE FF (TOWEL DISPOSABLE) ×3 IMPLANT
TRAY FOLEY MTR SLVR 16FR STAT (SET/KITS/TRAYS/PACK) ×3 IMPLANT
WATER STERILE IRR 1000ML POUR (IV SOLUTION) ×3 IMPLANT
YANKAUER SUCT BULB TIP NO VENT (SUCTIONS) ×3 IMPLANT

## 2019-08-17 NOTE — Anesthesia Procedure Notes (Signed)
Procedure Name: Intubation Date/Time: 08/17/2019 1:02 PM Performed by: Kathryne Hitch, CRNA Pre-anesthesia Checklist: Patient identified, Emergency Drugs available, Suction available and Patient being monitored Patient Re-evaluated:Patient Re-evaluated prior to induction Oxygen Delivery Method: Circle system utilized Preoxygenation: Pre-oxygenation with 100% oxygen Induction Type: IV induction Ventilation: Mask ventilation without difficulty Laryngoscope Size: Glidescope and 4 Grade View: Grade I Tube type: Oral Tube size: 7.5 mm Number of attempts: 1 Airway Equipment and Method: Stylet and Oral airway Placement Confirmation: ETT inserted through vocal cords under direct vision,  positive ETCO2 and breath sounds checked- equal and bilateral Secured at: 22 cm Tube secured with: Tape Dental Injury: Teeth and Oropharynx as per pre-operative assessment

## 2019-08-17 NOTE — Anesthesia Postprocedure Evaluation (Signed)
Anesthesia Post Note  Patient: Max Ryan  Procedure(s) Performed: ANTERIOR CERVICAL DECOMPRESSION FUSION CERVICAL THREE-FOUR, CERVICAL FOUR-FIVE, CERVICAL FIVE-SIX, CERVICAL SIX-SEVEN WITH INSTRUMENTATION AND ALLOGRAFT (N/A Spine Cervical)     Patient location during evaluation: PACU Anesthesia Type: General Level of consciousness: awake and alert, oriented and patient cooperative Pain management: pain level controlled Vital Signs Assessment: post-procedure vital signs reviewed and stable Respiratory status: spontaneous breathing, nonlabored ventilation and respiratory function stable Cardiovascular status: blood pressure returned to baseline and stable Postop Assessment: no apparent nausea or vomiting Anesthetic complications: no    Last Vitals:  Vitals:   08/17/19 0952  BP: (!) 145/97  Pulse: 74  Resp: 20  Temp: 36.8 C  SpO2: 97%    Last Pain:  Vitals:   08/17/19 1002  TempSrc:   PainSc: [place]

## 2019-08-17 NOTE — Op Note (Signed)
PATIENT NAME: Max Ryan RECORD NO.:   [id number]    DATE OF BIRTH: 1946-03-04  DATE OF PROCEDURE: 08/17/2019                               OPERATIVE REPORT     PREOPERATIVE DIAGNOSES: 1. Left-sided cervical radiculopathy. 2. Spinal stenosis spanning C3-C7. 3. Severe spinal cord compression, myelomalacia, and neuroforaminal stenosis C3-C7   POSTOPERATIVE DIAGNOSES: 1. Left-sided cervical radiculopathy. 2. Spinal stenosis spanning C3-C7. 3. Severe spinal cord compression, myelomalacia, and neuroforaminal stenosis C3-C7   PROCEDURE: 1. Anterior cervical decompression and fusion C3/4, C4/5, C5/6, C6/7. 2. Placement of anterior instrumentation, C3-C7. 3. Insertion of interbody device x 4 (Titan intervertebral spacers). 4. Intraoperative use of fluoroscopy. 5. Use of morselized allograft - ViviGen. 6. Extensive and meticulous removal of osteophytes spanning C3-C7   SURGEON:  Phylliss Bob, MD   ASSISTANT:  Pricilla Holm, PA-C.   ANESTHESIA:  General endotracheal anesthesia.   COMPLICATIONS:  None.   DISPOSITION:  Stable.   ESTIMATED BLOOD LOSS:  Minimal.   INDICATIONS FOR SURGERY:  Briefly, Mr. Blodgett is a pleasant 73 y.o. year- old patient, who did present to me with severe pain in the left arm.   The patient's MRI did reveal the findings noted above.  Given the patient's ongoing pain and MRI findings, we did discuss proceeding with the procedure noted above.  The patient was fully aware of the risks and limitations of surgery as outlined in my preoperative note.   OPERATIVE DETAILS:  On 08/17/2019,  the patient was brought to surgery and general endotracheal anesthesia was administered.  The patient was placed supine on the hospital bed. The neck was gently extended.  All bony prominences were meticulously padded.  The neck was prepped and draped in the usual sterile fashion.  At this point, I did make a left-sided transverse incision.  The platysma was  incised.  A Smith-Robinson approach was used and the anterior spine was identified. A self-retaining retractor was placed.  I then subperiosteally exposed the vertebral bodies from C3-C7.  Of note, exposure was extremely meticulous, as bone spurs were very prominent and extensive. I did require liberal use of AP and lateral fluoroscopy, as normal landmarks were completely unrecognizable due to the bony overgrowth. Once this was done, saspar pins were then placed into the C6 and C7 vertebral bodies and distraction was applied.  A thorough and complete C6-7 intervertebral diskectomy was performed.  Of note, this discectomy, and each discectomy above, was extremely meticulous and much more time-consuming than normal, as the endplates were irregular and the osteophytes were extensive. 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 Also, I was concerned that additional attempts at developing a place between the PLL and dura would cause undue trama to the spinal cord or dura, and would result in increased retraction to the esophagus, and I did not wish to additionally increase the patient's risk for dysphagia. I did however place a 71mm implant, in order to indirectly decompress the foramena. The implant was packed with ViviGen and tamped into position in the usual fashion.  The lower Caspar pin was then removed and placed into the C5 vertebral body and once again, distraction was applied across the C5-6 intervertebral space.  I then again performed a thorough and complete diskectomy, thoroughly decompressing the spinal canal. Once  again, OPLL was noted, and I did not wish to cause undue trauma to the spinal cord or dura. After preparing the endplates, the appropriate-sized intervertebral spacer (55mm) was packed with ViviGen and tamped into position.  The lower Caspar pin was then removed and placed into the C4 vertebral body and once again,  distraction was applied across the C4-5 intervertebral space.  I then performed a thorough and complete diskectomy, thoroughly decompressing the spinal canal and bilateral neuroforamena.  After preparing the endplates, the appropriate-sized intervertebral spacer was packed with ViviGen and tamped into position.  The lower Caspar pin was then removed and placed into the C3 vertebral body and once again, distraction was applied across the C3-4 intervertebral space.  I then again performed a thorough and complete diskectomy, thoroughly decompressing the spinal canal and bilateral neuroforamena.  After preparing the endplates, the appropriate-sized intervertebral spacer was packed with ViviGen and tamped into position.  The Caspar pins then were removed and bone wax was placed in their place.  The appropriate-sized anterior cervical plate was placed over the anterior spine.  14 mm variable angle screws were placed, 2 in each vertebral body from C3-C7 for a total of 10 vertebral body screws.  The screws were then locked to the plate using the Cam locking mechanism.  I was pleased with the final fluoroscopic images.  The wound was then irrigated.  The wound was then explored for any undue bleeding and there was no bleeding noted. The wound was then closed in layers using 2-0 Vicryl, followed by 4-0 Monocryl.  Benzoin and Steri-Strips were applied, followed by sterile dressing.  All instrument counts were correct at the termination of the procedure.   Of note, Pricilla Holm, PA-C, was my assistant throughout surgery, and did aid in retraction, suctioning, and closure from start to finish.     Phylliss Bob, MD

## 2019-08-17 NOTE — H&P (Signed)
PREOPERATIVE H&P  Chief Complaint: Left arm pain  HPI: Max Ryan is a 73 y.o. male who presents with ongoing pain in the left arm  MRI reveals multi-level disc herniations resulting in severe spinal cord compression and nerve compression spanning C3-C7  Patient has failed multiple forms of conservative care and continues to have pain (see office notes for additional details regarding the patient's full course of treatment)  Past Medical History:  Diagnosis Date  . Cancer ([place])    skin cancer  . Diabetes mellitus without complication ([place])   . History of kidney stones   . Hypertension   . Sarcoidosis    Past Surgical History:  Procedure Laterality Date  . CARPAL TUNNEL RELEASE Right   . CHOLECYSTECTOMY    . COLONOSCOPY    . CYSTOSCOPY/URETEROSCOPY/HOLMIUM LASER/STENT PLACEMENT Right 07/19/2019   Procedure: CYSTOSCOPY/URETEROSCOPY/HOLMIUM LASER/STENT PLACEMENT;  Surgeon: Ardis Hughs, MD;  Location: [facility];  Service: Urology;  Laterality: Right;  . HERNIA REPAIR    . JOINT REPLACEMENT Left    knee  . KNEE SURGERY Left   . LAPAROSCOPIC ROUX-EN-Y GASTRIC BYPASS WITH UPPER ENDOSCOPY AND REMOVAL OF LAP BAND    . SPINAL FUSION    . TONSILLECTOMY     Social History   Socioeconomic History  . Marital status: Married    Spouse name: Not on file  . Number of children: Not on file  . Years of education: Not on file  . Highest education level: Not on file  Occupational History  . Not on file  Social Needs  . Financial resource strain: Not on file  . Food insecurity    Worry: Not on file    Inability: Not on file  . Transportation needs    Medical: Not on file    Non-medical: Not on file  Tobacco Use  . Smoking status: Former Smoker    Quit date: 2005    Years since quitting: 15.7  . Smokeless tobacco: Never Used  Substance and Sexual Activity  . Alcohol use: Yes    Alcohol/week: 42.0 standard drinks    Types: 42 Glasses of wine per week  . Drug use:  Yes    Types: Marijuana    Comment: occ  . Sexual activity: Not on file  Lifestyle  . Physical activity    Days per week: Not on file    Minutes per session: Not on file  . Stress: Not on file  Relationships  . Social Herbalist on phone: Not on file    Gets together: Not on file    Attends religious service: Not on file    Active member of club or organization: Not on file    Attends meetings of clubs or organizations: Not on file    Relationship status: Not on file  Other Topics Concern  . Not on file  Social History Narrative  . Not on file   No family history on file. No Known Allergies Prior to Admission medications   Medication Sig Start Date End Date Taking? Authorizing Provider  Carbidopa 25 MG tablet Take 25 tablets by mouth 3 (three) times daily.   Yes [provider]  carbidopa-levodopa (SINEMET IR) 25-100 MG tablet Take 1 tablet by mouth at bedtime. 07/03/19  Yes [provider]  gabapentin (NEURONTIN) 600 MG tablet Take 600 mg by mouth 2 (two) times daily.   Yes [provider]  losartan (COZAAR) 25 MG tablet Take 25  mg by mouth daily.   Yes [provider]  lovastatin (MEVACOR) 10 MG tablet Take 10 mg by mouth daily.    Yes [provider]  metFORMIN (GLUCOPHAGE) 500 MG tablet Take 500 mg by mouth 2 (two) times daily.   Yes [provider]  oxyCODONE-acetaminophen (PERCOCET/ROXICET) 5-325 MG tablet Take 1 tablet by mouth every 6 (six) hours as needed for pain. 08/02/19  Yes [provider]  ondansetron (ZOFRAN ODT) 4 MG disintegrating tablet 4mg  ODT q4 hours prn nausea/vomit Patient not taking: Reported on 08/09/2019 07/18/19   Deno Etienne, DO  phenazopyridine (PYRIDIUM) 200 MG tablet Take 1 tablet (200 mg total) by mouth 3 (three) times daily as needed for pain. Patient not taking: Reported on 08/09/2019 07/19/19   Ardis Hughs, MD  tamsulosin (FLOMAX) 0.4 MG CAPS capsule Take 1 capsule (0.4 mg  total) by mouth daily after supper. Patient not taking: Reported on 08/09/2019 07/18/19   Deno Etienne, DO  traMADol (ULTRAM) 50 MG tablet Take 1-2 tablets (50-100 mg total) by mouth every 6 (six) hours as needed for moderate pain. Patient not taking: Reported on 08/09/2019 07/19/19   Ardis Hughs, MD     All other systems have been reviewed and were otherwise negative with the exception of those mentioned in the HPI and as above.  Physical Exam: There were no vitals filed for this visit.  There is no height or weight on file to calculate BMI.  General: Alert, no acute distress Cardiovascular: No pedal edema Respiratory: No cyanosis, no use of accessory musculature Skin: No lesions in the area of chief complaint Neurologic: Sensation intact distally Psychiatric: Patient is competent for consent with normal mood and affect Lymphatic: No axillary or cervical lymphadenopathy  MUSCULOSKELETAL: + hoffman's sign bilterally  Assessment/Plan: CERVICAL STENOSIS SPANNING CERVICAL 3 - CERVICAL 7, SPINAL CORD COMPRESSION AND MYELOMALACIA Plan for Procedure(s): ANTERIOR CERVICAL DECOMPRESSION FUSION CERVICAL 3-4, CERVICAL 4-5, CERVICAL 5-6, CERVICAL 6-7 WITH INSTRUMENTATION AND ALLOGRAFT   [name], MD 08/17/2019 8:38 AM

## 2019-08-17 NOTE — Transfer of Care (Signed)
Immediate Anesthesia Transfer of Care Note  Patient: Max Ryan  Procedure(s) Performed: ANTERIOR CERVICAL DECOMPRESSION FUSION CERVICAL THREE-FOUR, CERVICAL FOUR-FIVE, CERVICAL FIVE-SIX, CERVICAL SIX-SEVEN WITH INSTRUMENTATION AND ALLOGRAFT (N/A Spine Cervical)  Patient Location: PACU  Anesthesia Type:General  Level of Consciousness: awake  Airway & Oxygen Therapy: Patient Spontanous Breathing and Patient connected to nasal cannula oxygen  Post-op Assessment: Report given to RN  Post vital signs: Reviewed and stable  Last Vitals:  Vitals Value Taken Time  BP    Temp    Pulse 87 08/17/19 1738  Resp 13 08/17/19 1738  SpO2 93 % 08/17/19 1738  Vitals shown include unvalidated device data.  Last Pain:  Vitals:   08/17/19 1002  TempSrc:   PainSc: 5       Patients Stated Pain Goal: 5 ([id number])  Complications: No apparent anesthesia complications

## 2019-08-18 DIAGNOSIS — M4802 Spinal stenosis, cervical region: Secondary | ICD-10-CM | POA: Diagnosis present

## 2019-08-18 DIAGNOSIS — M5011 Cervical disc disorder with radiculopathy,  high cervical region: Secondary | ICD-10-CM | POA: Diagnosis present

## 2019-08-18 DIAGNOSIS — E1141 Type 2 diabetes mellitus with diabetic mononeuropathy: Secondary | ICD-10-CM | POA: Diagnosis present

## 2019-08-18 DIAGNOSIS — I10 Essential (primary) hypertension: Secondary | ICD-10-CM | POA: Diagnosis present

## 2019-08-18 DIAGNOSIS — M2578 Osteophyte, vertebrae: Secondary | ICD-10-CM | POA: Diagnosis present

## 2019-08-18 DIAGNOSIS — Z79899 Other long term (current) drug therapy: Secondary | ICD-10-CM | POA: Diagnosis not present

## 2019-08-18 DIAGNOSIS — M4602 Spinal enthesopathy, cervical region: Secondary | ICD-10-CM | POA: Diagnosis present

## 2019-08-18 DIAGNOSIS — G9589 Other specified diseases of spinal cord: Secondary | ICD-10-CM | POA: Diagnosis present

## 2019-08-18 DIAGNOSIS — M5001 Cervical disc disorder with myelopathy,  high cervical region: Secondary | ICD-10-CM | POA: Diagnosis present

## 2019-08-18 DIAGNOSIS — Z9884 Bariatric surgery status: Secondary | ICD-10-CM | POA: Diagnosis not present

## 2019-08-18 DIAGNOSIS — Z96652 Presence of left artificial knee joint: Secondary | ICD-10-CM | POA: Diagnosis present

## 2019-08-18 DIAGNOSIS — Z7984 Long term (current) use of oral hypoglycemic drugs: Secondary | ICD-10-CM | POA: Diagnosis not present

## 2019-08-18 DIAGNOSIS — Z20828 Contact with and (suspected) exposure to other viral communicable diseases: Secondary | ICD-10-CM | POA: Diagnosis present

## 2019-08-18 DIAGNOSIS — Z87891 Personal history of nicotine dependence: Secondary | ICD-10-CM | POA: Diagnosis not present

## 2019-08-18 LAB — GLUCOSE, CAPILLARY: Glucose-Capillary: 144 mg/dL — ABNORMAL HIGH (ref 70–99)

## 2019-08-18 MED ORDER — OXYCODONE-ACETAMINOPHEN 5-325 MG PO TABS: 1.0000 | ORAL_TABLET | ORAL | 0 refills | Status: DC | PRN

## 2019-08-18 MED ORDER — PANTOPRAZOLE SODIUM 40 MG PO TBEC
40.0000 mg | DELAYED_RELEASE_TABLET | Freq: Every day | ORAL | Status: DC
  Administered 2019-08-18: 09:00:00 40 mg via ORAL
  Filled 2019-08-18: qty 1

## 2019-08-18 MED ORDER — CARBIDOPA-LEVODOPA 25-100 MG PO TABS: 1.0000 | ORAL_TABLET | Freq: Every day | ORAL | Status: DC

## 2019-08-18 MED ORDER — DIAZEPAM 5 MG PO TABS: 5.0000 mg | ORAL_TABLET | Freq: Four times a day (QID) | ORAL | 0 refills | Status: DC | PRN

## 2019-08-18 NOTE — Progress Notes (Signed)
Discharged instructions/education/Rx/AVS given to patient and verbalized understanding. PAtient swallowing well, voice is clear. No swelling, no drainage noted on incision site, dressing intact. MAE well, voiding and emptying bladder well. Ambulating independently on the hallways. Awaiting for his transport.

## 2019-08-18 NOTE — Progress Notes (Signed)
    Patient doing well  Denies arm pain and is very happy with results of surgery Tolerating PO well, including liquids and solids   Physical Exam: Vitals:   08/18/19 0340 08/18/19 0726  BP: 131/88 136/90  Pulse: 88 82  Resp: 20 16  Temp: 97.9 F (36.6 C) 98 F (36.7 C)  SpO2: 94% 95%    Neck soft/supple Dressing in place NVI  POD #1 s/p ACDF, doing well with resolved arm pain  - encourage ambulation - Percocet for pain, Valium for muscle spasms - d/c home today with f/u in 2 weeks - I did encourage patient to call office if swallowing or breathing becomes labored

## 2019-08-19 ENCOUNTER — Encounter (HOSPITAL_COMMUNITY): Payer: Self-pay | Admitting: Orthopedic Surgery

## 2019-08-24 NOTE — Discharge Summary (Signed)
Patient ID: Max Ryan MRN: [id number] DOB/AGE: May 25, 1946 73 y.o.  Admit date: 08/17/2019 Discharge date: 08/18/2019  Admission Diagnoses:  Active Problems:   Myeloradiculopathy   Discharge Diagnoses:  Same  Past Medical History:  Diagnosis Date  . Cancer ([place])    skin cancer  . Diabetes mellitus without complication ([place])   . History of kidney stones   . Hypertension   . Sarcoidosis     Surgeries: Procedure(s): ANTERIOR CERVICAL DECOMPRESSION FUSION CERVICAL THREE-FOUR, CERVICAL FOUR-FIVE, CERVICAL FIVE-SIX, CERVICAL SIX-SEVEN WITH INSTRUMENTATION AND ALLOGRAFT on 08/17/2019   Consultants: None  Discharged Condition: Improved  Hospital Course: Max Ryan is an 73 y.o. male who was admitted 08/17/2019 for operative treatment of radiculopathy. Patient has severe unremitting pain that affects sleep, daily activities, and work/hobbies. After pre-op clearance the patient was taken to the operating room on 08/17/2019 and underwent  Procedure(s): ANTERIOR CERVICAL DECOMPRESSION FUSION CERVICAL THREE-FOUR, CERVICAL FOUR-FIVE, CERVICAL FIVE-SIX, CERVICAL SIX-SEVEN WITH INSTRUMENTATION AND ALLOGRAFT.    Patient was given perioperative antibiotics:  Anti-infectives (From admission, onward)   Start     Dose/Rate Route Frequency Ordered Stop   08/18/19 0100  ceFAZolin (ANCEF) IVPB 2g/100 mL premix     2 g 200 mL/hr over 30 Minutes Intravenous Every 8 hours 08/17/19 1912 08/18/19 0755   08/17/19 0945  ceFAZolin (ANCEF) IVPB 2g/100 mL premix     2 g 200 mL/hr over 30 Minutes Intravenous On call to O.R. 08/17/19 0934 08/17/19 1705       Patient was given sequential compression devices, early ambulation to prevent DVT.  Patient benefited maximally from hospital stay and there were no complications.    Recent vital signs: BP 136/90 (BP Location: Left Arm)   Pulse 82   Temp 98 F (36.7 C) (Oral)   Resp 16   Ht 5\' 8"  (1.727 m)   Wt 115.7 kg   SpO2 95%   BMI 38.77  kg/m    Discharge Medications:   Allergies as of 08/18/2019   No Known Allergies     Medication List    TAKE these medications   Carbidopa 25 MG tablet Take 25 tablets by mouth 3 (three) times daily.   carbidopa-levodopa 25-100 MG tablet Commonly known as: SINEMET IR Take 1 tablet by mouth at bedtime.   diazepam 5 MG tablet Commonly known as: VALIUM Take 1 tablet (5 mg total) by mouth every 6 (six) hours as needed for muscle spasms.   gabapentin 600 MG tablet Commonly known as: NEURONTIN Take 600 mg by mouth 2 (two) times daily.   losartan 25 MG tablet Commonly known as: COZAAR Take 25 mg by mouth daily.   lovastatin 10 MG tablet Commonly known as: MEVACOR Take 10 mg by mouth daily.   metFORMIN 500 MG tablet Commonly known as: GLUCOPHAGE Take 500 mg by mouth 2 (two) times daily.   ondansetron 4 MG disintegrating tablet Commonly known as: Zofran ODT 4mg  ODT q4 hours prn nausea/vomit   oxyCODONE-acetaminophen 5-325 MG tablet Commonly known as: PERCOCET/ROXICET Take 1-2 tablets by mouth every 4 (four) hours as needed for moderate pain or severe pain. What changed:   how much to take  when to take this  reasons to take this   phenazopyridine 200 MG tablet Commonly known as: Pyridium Take 1 tablet (200 mg total) by mouth 3 (three) times daily as needed for pain.   tamsulosin 0.4 MG Caps capsule Commonly known as: FLOMAX Take 1 capsule (0.4 mg total) by  mouth daily after supper.       Diagnostic Studies: Dg Cervical Spine 2-3 Views  Result Date: 08/17/2019 CLINICAL DATA:  C3-C7 ACDF EXAM: CERVICAL SPINE - 2-3 VIEW; DG C-ARM 1-60 MIN COMPARISON:  None. FINDINGS: 2 intraop views from ACDF C3-C7.  Fluoro time 46 seconds IMPRESSION: Intraop views from ACDF C3-C7 Electronically Signed   By: Prudencio Pair M.D.   On: 08/17/2019 19:34   Mr Shoulder Left Wo Contrast  Result Date: 07/30/2019 CLINICAL DATA:  Left shoulder pain and pain for 6 weeks. EXAM: MRI OF THE  LEFT SHOULDER WITHOUT CONTRAST TECHNIQUE: Multiplanar, multisequence MR imaging of the shoulder was performed. No intravenous contrast was administered. COMPARISON:  None. FINDINGS: Rotator cuff: Mild tendinosis of the supraspinatus tendon with a possible tiny partial-thickness bursal surface tear anteriorly. Mild tendinosis of the infraspinatus tendon. Teres minor tendon is intact. Subscapularis tendon is intact. Muscles: No atrophy or fatty replacement of nor abnormal signal within, the muscles of the rotator cuff. Biceps long head: Moderate tendinosis of the intra-articular portion of the long head of the biceps tendon. Acromioclavicular Joint: Moderate arthropathy of the acromioclavicular joint. Type II acromion. No subacromial/subdeltoid bursal fluid. Glenohumeral Joint: No joint effusion. No chondral defect. Labrum: Limited evaluation of the labrum secondary lack of intra-articular fluid. Increased signal and irregularity of the superior posterior labrum most consistent with degeneration. Bones:  No acute osseous abnormality.  No aggressive osseous lesion. Other: No fluid collection or hematoma. IMPRESSION: 1. Mild tendinosis of the supraspinatus tendon with a possible tiny partial-thickness bursal surface tear anteriorly. 2. Mild tendinosis of the infraspinatus tendon. 3. Moderate tendinosis of the intra-articular portion of the long head of the biceps tendon. Electronically Signed   By: Kathreen Devoid   On: 07/30/2019 10:56   Dg C-arm 1-60 Min  Result Date: 08/17/2019 CLINICAL DATA:  C3-C7 ACDF EXAM: CERVICAL SPINE - 2-3 VIEW; DG C-ARM 1-60 MIN COMPARISON:  None. FINDINGS: 2 intraop views from ACDF C3-C7.  Fluoro time 46 seconds IMPRESSION: Intraop views from ACDF C3-C7 Electronically Signed   By: Prudencio Pair M.D.   On: 08/17/2019 19:34    Disposition:    POD #1 s/p ACDF, doing well with resolved arm pain  - encourage ambulation - Percocet for pain, Valium for muscle spasms -Scripts for pain  sent to pharmacy electronically  -D/C instructions sheet printed and in chart -D/C today  -F/U in office 2 weeks   Signed: Lennie Muckle Maripaz Mullan 08/24/2019, 1:26 PM

## 2019-11-24 ENCOUNTER — Ambulatory Visit
Admission: EM | Admit: 2019-11-24 | Discharge: 2019-11-24 | Disposition: A | Discharge status: Home / Self Care | Payer: Medicare Other | Attending: Physician Assistant | Admitting: Physician Assistant

## 2019-11-24 ENCOUNTER — Ambulatory Visit (INDEPENDENT_AMBULATORY_CARE_PROVIDER_SITE_OTHER): Payer: Medicare Other

## 2019-11-24 DIAGNOSIS — S60932A Unspecified superficial injury of left thumb, initial encounter: Secondary | ICD-10-CM | POA: Diagnosis not present

## 2019-11-24 DIAGNOSIS — M79645 Pain in left finger(s): Secondary | ICD-10-CM | POA: Diagnosis not present

## 2019-11-24 DIAGNOSIS — I10 Essential (primary) hypertension: Secondary | ICD-10-CM | POA: Diagnosis not present

## 2019-11-24 NOTE — ED Provider Notes (Signed)
[facility]    CSN: [id number] Arrival date & time: 11/24/19  1358      History   Chief Complaint Chief Complaint  Patient presents with  . Hand Pain    HPI Max Ryan is a 74 y.o. male.   74 year old male with history of DM, HTN, sarcoidosis comes in for evaluation of left thumb pain after fall 2 days ago.  States slipped in mud, and landed on his left shoulder, hand, face.  Denies loss of consciousness.  Was able to ambulate on own without difficulty.  Since fall, has not had any headaches, dizziness, weakness, confusion, photophobia, nausea/vomiting.  Denies left shoulder pain/elbow pain.  States has had left thumb pain and swelling that has not been improving with ice.  Denies numbness, tingling.  Of note, patient recently had surgery for fusion to the cervical spine 08/2019.  Denies neck pain since fall.  Denies numbness/tingling down the arm, loss of grip strength.  Denies blood thinner use.     Past Medical History:  Diagnosis Date  . Cancer ([place])    skin cancer  . Diabetes mellitus without complication ([place])   . History of kidney stones   . Hypertension   . Sarcoidosis     Patient Active Problem List   Diagnosis Date Noted  . 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 ANTERIOR CERVICAL DECOMPRESSION/DISCECTOMY FUSION 4 LEVELS N/A 08/17/2019   Procedure: ANTERIOR CERVICAL DECOMPRESSION FUSION CERVICAL THREE-FOUR, CERVICAL FOUR-FIVE, CERVICAL FIVE-SIX, CERVICAL SIX-SEVEN WITH INSTRUMENTATION AND ALLOGRAFT;  Surgeon: Phylliss Bob, MD;  Location: [facility];  Service: Orthopedics;  Laterality: N/A;  . CARPAL TUNNEL RELEASE Right   . CHOLECYSTECTOMY    . COLONOSCOPY    . CYSTOSCOPY/URETEROSCOPY/HOLMIUM LASER/STENT PLACEMENT Right 07/19/2019   Procedure: CYSTOSCOPY/URETEROSCOPY/HOLMIUM LASER/STENT PLACEMENT;  Surgeon: Ardis Hughs, MD;  Location: [facility];  Service: Urology;  Laterality: Right;  . HERNIA  REPAIR    . JOINT REPLACEMENT Left    knee  . KNEE SURGERY Left   . LAPAROSCOPIC ROUX-EN-Y GASTRIC BYPASS WITH UPPER ENDOSCOPY AND REMOVAL OF LAP BAND    . SPINAL FUSION    . TONSILLECTOMY         Home Medications    Prior to Admission medications   Medication Sig Start Date End Date Taking? Authorizing Provider  Carbidopa 25 MG tablet Take 25 tablets by mouth 3 (three) times daily.    [provider]  carbidopa-levodopa (SINEMET IR) 25-100 MG tablet Take 1 tablet by mouth at bedtime. 07/03/19   [provider]  gabapentin (NEURONTIN) 600 MG tablet Take 600 mg by mouth 2 (two) times daily.    [provider]  losartan (COZAAR) 25 MG tablet Take 25 mg by mouth daily.    [provider]  lovastatin (MEVACOR) 10 MG tablet Take 10 mg by mouth daily.     [provider]  metFORMIN (GLUCOPHAGE) 500 MG tablet Take 500 mg by mouth 2 (two) times daily.    [provider]    Family History No family history on file.  Social History Social History   Tobacco Use  . Smoking status: Former Smoker    Quit date: 2005    Years since quitting: 16.0  . Smokeless tobacco: Never Used  Substance Use Topics  . Alcohol use: Yes    Alcohol/week: 42.0 standard drinks    Types: 42 Glasses of wine per week  . Drug use: Yes  Types: Marijuana    Comment: occ     Allergies   Patient has no known allergies.   Review of Systems Review of Systems  Reason unable to perform ROS: See HPI as above.     Physical Exam Triage Vital Signs ED Triage Vitals [11/24/19 1411]  Enc Vitals Group     BP (!) 157/95     Pulse Rate 72     Resp 18     Temp 98 F (36.7 C)     Temp Source Oral     SpO2 97 %     Weight      Height      Head Circumference      Peak Flow      Pain Score 6     Pain Loc      Pain Edu?      Excl. in [place]?    No data found.  Updated Vital Signs BP (!) 157/95 (BP Location: Left Arm)   Pulse 72   Temp 98 F (36.7  C) (Oral)   Resp 18   SpO2 97%   Physical Exam Constitutional:      General: He is not in acute distress.    Appearance: Normal appearance. He is well-developed. He is not toxic-appearing or diaphoretic.  HENT:     Head: Normocephalic and atraumatic.  Eyes:     Extraocular Movements: Extraocular movements intact.     Conjunctiva/sclera: Conjunctivae normal.     Pupils: Pupils are equal, round, and reactive to light.  Pulmonary:     Effort: Pulmonary effort is normal. No respiratory distress.     Comments: Speaking in full sentences without difficulty Musculoskeletal:     Cervical back: Normal range of motion and neck supple. No pain with movement, spinous process tenderness or muscular tenderness.     Comments: Swelling along 1st MCP and thumb to the left hand. Contusion to the left thumb. Tenderness to palpation along 1st MCP, thumb. Decreased flexion of the MCPJ. Minimal flexion to the IP joint. Sensation intact. Radial pulse 2+, cap refill <2s.  No tenderness to palpation of shoulder, upper arm, elbow, forearm.   Skin:    General: Skin is warm and dry.  Neurological:     Mental Status: He is alert and oriented to person, place, and time.     GCS: GCS eye subscore is 4. GCS verbal subscore is 5. GCS motor subscore is 6.     Coordination: Coordination is intact.     Gait: Gait is intact.    UC Treatments / Results  Labs (all labs ordered are listed, but only abnormal results are displayed) Labs Reviewed - No data to display  EKG   Radiology DG Finger Thumb Left  Result Date: 11/24/2019 CLINICAL DATA:  Fall with thumb swelling EXAM: LEFT THUMB 2+V COMPARISON:  None. FINDINGS: Possible small intra-articular fracture at the base of the proximal phalanx. No subluxation. Mild degenerative changes at the [facility], IP and MCP joints. IMPRESSION: Possible small intra-articular fracture at the base of the first proximal phalanx Electronically Signed   By: Donavan Foil M.D.   On:  11/24/2019 14:38    Procedures Procedures (including critical care time)  Medications Ordered in UC Medications - No data to display  Initial Impression / Assessment and Plan / UC Course  I have reviewed the triage vital signs and the nursing notes.  Pertinent labs & imaging results that were available during my care of the patient were reviewed  by me and considered in my medical decision making (see chart for details).    Discussed xray results with patient. Will splint thumb at this time with ice compress. Symptomatic treatment discussed. Patient already established with orthopedics. Will have patient follow up if symptoms not improving.   Final Clinical Impressions(s) / UC Diagnoses   Final diagnoses:  Pain of left thumb   ED Prescriptions    None     PDMP not reviewed this encounter.   Ok Edwards, PA-C 11/24/19 1523

## 2019-11-24 NOTE — ED Triage Notes (Signed)
Pt states working in the garden on Tuesday, slipped in the mud landing on lt shoulder, lt hand and his face. Pt c/o lt hand pain and swelling. States using ice with no relief.

## 2019-11-24 NOTE — Discharge Instructions (Signed)
As discussed, xray shows possible fracture. We will splint the thumb at this time. Ice compress, elevation. Aleve 440mg  twice a day. Tylenol as needed. Follow up with hand orthopedics if symptoms not improving.

## 2020-02-03 ENCOUNTER — Other Ambulatory Visit: Payer: Self-pay | Admitting: Orthopedic Surgery

## 2020-02-03 DIAGNOSIS — M5412 Radiculopathy, cervical region: Secondary | ICD-10-CM

## 2020-02-13 ENCOUNTER — Other Ambulatory Visit: Payer: Self-pay

## 2020-02-13 ENCOUNTER — Ambulatory Visit
Admission: RE | Admit: 2020-02-13 | Discharge: 2020-02-13 | Disposition: A | Discharge status: Home / Self Care | Payer: Medicare Other | Source: Ambulatory Visit | Attending: Orthopedic Surgery | Admitting: Orthopedic Surgery

## 2020-02-13 DIAGNOSIS — M5412 Radiculopathy, cervical region: Secondary | ICD-10-CM

## 2020-04-03 ENCOUNTER — Other Ambulatory Visit: Payer: Self-pay | Admitting: Orthopedic Surgery

## 2020-04-19 ENCOUNTER — Encounter (HOSPITAL_COMMUNITY): Payer: Self-pay

## 2020-04-19 ENCOUNTER — Encounter (HOSPITAL_COMMUNITY)
Admission: RE | Admit: 2020-04-19 | Discharge: 2020-04-19 | Disposition: A | Discharge status: Home / Self Care | Payer: Medicare Other | Source: Ambulatory Visit | Attending: Orthopedic Surgery | Admitting: Orthopedic Surgery

## 2020-04-19 ENCOUNTER — Other Ambulatory Visit: Payer: Self-pay

## 2020-04-19 DIAGNOSIS — Z01812 Encounter for preprocedural laboratory examination: Secondary | ICD-10-CM | POA: Insufficient documentation

## 2020-04-19 LAB — URINALYSIS, ROUTINE W REFLEX MICROSCOPIC
Bilirubin Urine: NEGATIVE
Glucose, UA: NEGATIVE mg/dL
Hgb urine dipstick: NEGATIVE
Ketones, ur: NEGATIVE mg/dL
Leukocytes,Ua: NEGATIVE
Nitrite: NEGATIVE
Protein, ur: NEGATIVE mg/dL
Specific Gravity, Urine: 1.008 (ref 1.005–1.030)
pH: 5 (ref 5.0–8.0)

## 2020-04-19 LAB — SURGICAL PCR SCREEN
MRSA, PCR: NEGATIVE
Staphylococcus aureus: NEGATIVE

## 2020-04-19 LAB — CBC WITH DIFFERENTIAL/PLATELET
Abs Immature Granulocytes: 0.03 10*3/uL (ref 0.00–0.07)
Basophils Absolute: 0.1 10*3/uL (ref 0.0–0.1)
Basophils Relative: 1 %
Eosinophils Absolute: 0.4 10*3/uL (ref 0.0–0.5)
Eosinophils Relative: 5 %
HCT: 42.6 % (ref 39.0–52.0)
Hemoglobin: 14.2 g/dL (ref 13.0–17.0)
Immature Granulocytes: 0 %
Lymphocytes Relative: 18 %
Lymphs Abs: 1.6 10*3/uL (ref 0.7–4.0)
MCH: 31.1 pg (ref 26.0–34.0)
MCHC: 33.3 g/dL (ref 30.0–36.0)
MCV: 93.2 fL (ref 80.0–100.0)
Monocytes Absolute: 0.5 10*3/uL (ref 0.1–1.0)
Monocytes Relative: 6 %
Neutro Abs: 6 10*3/uL (ref 1.7–7.7)
Neutrophils Relative %: 70 %
Platelets: 239 10*3/uL (ref 150–400)
RBC: 4.57 MIL/uL (ref 4.22–5.81)
RDW: 12.2 % (ref 11.5–15.5)
WBC: 8.6 10*3/uL (ref 4.0–10.5)
nRBC: 0 % (ref 0.0–0.2)

## 2020-04-19 LAB — COMPREHENSIVE METABOLIC PANEL
ALT: 29 U/L (ref 0–44)
AST: 22 U/L (ref 15–41)
Albumin: 4 g/dL (ref 3.5–5.0)
Alkaline Phosphatase: 52 U/L (ref 38–126)
Anion gap: 10 (ref 5–15)
BUN: 15 mg/dL (ref 8–23)
CO2: 25 mmol/L (ref 22–32)
Calcium: 9.5 mg/dL (ref 8.9–10.3)
Chloride: 100 mmol/L (ref 98–111)
Creatinine, Ser: 0.79 mg/dL (ref 0.61–1.24)
GFR calc Af Amer: >60 mL/min (ref >60)
GFR calc non Af Amer: >60 mL/min (ref >60)
Glucose, Bld: 193 mg/dL — ABNORMAL HIGH (ref 70–99)
Potassium: 4.1 mmol/L (ref 3.5–5.1)
Sodium: 135 mmol/L (ref 135–145)
Total Bilirubin: 0.7 mg/dL (ref 0.3–1.2)
Total Protein: 6.8 g/dL (ref 6.5–8.1)

## 2020-04-19 LAB — HEMOGLOBIN A1C
Hgb A1c MFr Bld: 8.8 % — ABNORMAL HIGH (ref 4.8–5.6)
Mean Plasma Glucose: 205.86 mg/dL

## 2020-04-19 LAB — TYPE AND SCREEN
ABO/RH(D): A POS
Antibody Screen: NEGATIVE

## 2020-04-19 LAB — GLUCOSE, CAPILLARY: Glucose-Capillary: 162 mg/dL — ABNORMAL HIGH (ref 70–99)

## 2020-04-19 LAB — PROTIME-INR
INR: 1.1 (ref 0.8–1.2)
Prothrombin Time: 13.6 seconds (ref 11.4–15.2)

## 2020-04-19 LAB — APTT: aPTT: 29 seconds (ref 24–36)

## 2020-04-19 NOTE — Progress Notes (Signed)
PCP - Burfoid, MD (In [place]. Pt usually resides in [place] but d/t covid, he has a place here in [place] that he decided to stay at temporarily)  Cardiologist - pt denies (pt stated he saw a cardiologist in [facility] for a check-up and had a chemical induced stress test done around 2015 but doesn't remember MD's name)   PPM/ICD - n/a Device Orders -  Rep Notified -   Chest x-ray - n/a EKG - 08/11/19 Stress Test - see above ECHO - 06/17/06 Cardiac Cath - pt denies   Sleep Study - had one scheduled in [place], but due to covid and being in [place], he missed his appt. CPAP - no  Fasting Blood Sugar - does not check CBG at home Checks Blood Sugar n/a  Blood Thinner Instructions: n/a Aspirin Instructions: n/a  ERAS Protcol - yes  PRE-SURGERY Ensure or G2- G2  COVID TEST- 04/23/20  Coronavirus Screening  Have you experienced the following symptoms:  Cough yes/no: No Fever (>100.68F)  yes/no: No Runny nose yes/no: No Sore throat yes/no: No Difficulty breathing/shortness of breath  yes/no: No  Have you or a family member traveled in the last 14 days and where? yes/no: No   If the patient indicates "YES" to the above questions, their PAT will be rescheduled to limit the exposure to others and, the surgeon will be notified. THE PATIENT WILL NEED TO BE ASYMPTOMATIC FOR 14 DAYS.   If the patient is not experiencing any of these symptoms, the PAT nurse will instruct them to NOT bring anyone with them to their appointment since they may have these symptoms or traveled as well.   Please remind your patients and families that hospital visitation restrictions are in effect and the importance of the restrictions.     Anesthesia review: n/a  Patient denies shortness of breath, fever, cough and chest pain at PAT appointment   All instructions explained to the patient, with a verbal understanding of the material. Patient agrees to go over the instructions while at home for a better understanding. Patient also  instructed to self quarantine after being tested for COVID-19. The opportunity to ask questions was provided.

## 2020-04-19 NOTE — Pre-Procedure Instructions (Signed)
Your procedure is scheduled on Thursday, June 17th, 2021 from 1:54 PM- 6:09 PM.  Report to [facility] Main Entrance "A" at 10:55 A.M., and check in at the Admitting office.  Call this number if you have problems the morning of surgery:  [phone]  Call [phone] if you have any questions prior to your surgery date Monday-Friday 8am-4pm.    Remember:  Do not eat after midnight the night before your surgery.  You may drink clear liquids until 10:55 AM the morning of your surgery.    Clear liquids allowed are: Water, Non-Citrus Juices (without pulp), Carbonated Beverages, Clear Tea, Black Coffee Only, and Gatorade.   Enhanced Recovery after Surgery for Orthopedics Enhanced Recovery after Surgery is a protocol used to improve the stress on your body and your recovery after surgery.   . The day of surgery (if you have diabetes): o  o Drink ONE (1) Gatorade 2 (G2) by 10:55 AM. o This drink was given to you during your hospital  pre-op appointment visit.  o Color of the Gatorade may vary. Red is not allowed. o Nothing else to drink after completing the  Gatorade 2 (G2).         If you have questions, please contact your surgeon's office.     Take these medicines the morning of surgery with A SIP OF WATER: gabapentin (NEURONTIN) lovastatin (MEVACOR) tamsulosin (FLOMAX)  IF NEEDED: acetaminophen-codeine (TYLENOL #3)   As of today, STOP taking any Aspirin (unless otherwise instructed by your surgeon) and Aspirin containing products, Aleve, Naproxen, Ibuprofen, Motrin, Advil, Goody's, BC's, all herbal medications, fish oil, and all vitamins.   WHAT DO I DO ABOUT MY DIABETES MEDICATION?  Marland Kitchen Do not take metFORMIN (GLUCOPHAGE) the morning of surgery.    HOW TO MANAGE YOUR DIABETES BEFORE AND AFTER SURGERY  Why is it important to control my blood sugar before and after surgery? . Improving blood sugar levels before and after surgery helps healing and can limit  problems. . A way of improving blood sugar control is eating a healthy diet by: o  Eating less sugar and carbohydrates o  Increasing activity/exercise o  Talking with your doctor about reaching your blood sugar goals . High blood sugars (greater than 180 mg/dL) can raise your risk of infections and slow your recovery, so you will need to focus on controlling your diabetes during the weeks before surgery. . Make sure that the doctor who takes care of your diabetes knows about your planned surgery including the date and location.  How do I manage my blood sugar before surgery? . Check your blood sugar at least 4 times a day, starting 2 days before surgery, to make sure that the level is not too high or low. . Check your blood sugar the morning of your surgery when you wake up and every 2 hours until you get to the Short Stay unit. o If your blood sugar is less than 70 mg/dL, you will need to treat for low blood sugar: - Do not take insulin. - Treat a low blood sugar (less than 70 mg/dL) with  cup of clear juice (cranberry or apple), 4 glucose tablets, OR glucose gel. - Recheck blood sugar in 15 minutes after treatment (to make sure it is greater than 70 mg/dL). If your blood sugar is not greater than 70 mg/dL on recheck, call [phone] for further instructions. . Report your blood sugar to the short stay nurse when you get to Short Stay.  Marland Kitchen  If you are admitted to the hospital after surgery: o Your blood sugar will be checked by the staff and you will probably be given insulin after surgery (instead of oral diabetes medicines) to make sure you have good blood sugar levels. o The goal for blood sugar control after surgery is 80-180 mg/dL.            The Morning of Surgery:            Do not wear jewelry.            Do not wear lotions, powders, colognes, or deodorant.            Men may shave face and neck.            Do not bring valuables to the hospital.            [facility] is not  responsible for any belongings or valuables.  Do NOT Smoke (Tobacco/Vapping) or drink Alcohol 24 hours prior to your procedure.  If you use a CPAP at night, you may bring all equipment for your overnight stay.   Contacts, glasses, dentures or bridgework may not be worn into surgery.      For patients admitted to the hospital, discharge time will be determined by your treatment team.   Patients discharged the day of surgery will not be allowed to drive home, and someone needs to stay with them for 24 hours.    Special instructions:   [facility]- Preparing For Surgery  Before surgery, you can play an important role. Because skin is not sterile, your skin needs to be as free of germs as possible. You can reduce the number of germs on your skin by washing with CHG (chlorahexidine gluconate) Soap before surgery.  CHG is an antiseptic cleaner which kills germs and bonds with the skin to continue killing germs even after washing.    Oral Hygiene is also important to reduce your risk of infection.  Remember - BRUSH YOUR TEETH THE MORNING OF SURGERY WITH YOUR REGULAR TOOTHPASTE  Please do not use if you have an allergy to CHG or antibacterial soaps. If your skin becomes reddened/irritated stop using the CHG.  Do not shave (including legs and underarms) for at least 48 hours prior to first CHG shower. It is OK to shave your face.  Please follow these instructions carefully.   1. Shower the NIGHT BEFORE SURGERY and the MORNING OF SURGERY with CHG Soap.   2. If you chose to wash your hair, wash your hair first as usual with your normal shampoo.  3. After you shampoo, rinse your hair and body thoroughly to remove the shampoo.  4. Use CHG as you would any other liquid soap. You can apply CHG directly to the skin and wash gently with a scrungie or a clean washcloth.   5. Apply the CHG Soap to your body ONLY FROM THE NECK DOWN.  Do not use on open wounds or open sores. Avoid contact with your  eyes, ears, mouth and genitals (private parts). Wash Face and genitals (private parts)  with your normal soap.   6. Wash thoroughly, paying special attention to the area where your surgery will be performed.  7. Thoroughly rinse your body with warm water from the neck down.  8. DO NOT shower/wash with your normal soap after using and rinsing off the CHG Soap.  9. Pat yourself dry with a CLEAN TOWEL.  10. Wear CLEAN PAJAMAS to bed  the night before surgery, wear comfortable clothes the morning of surgery  11. Place CLEAN SHEETS on your bed the night of your first shower and DO NOT SLEEP WITH PETS.   Day of Surgery: Shower with CHG Soap.  Do not apply any deodorants/lotions.  Please wear clean clothes to the hospital/surgery center.   Remember to brush your teeth WITH YOUR REGULAR TOOTHPASTE.   Please read over the following fact sheets that you were given.

## 2020-04-19 NOTE — Progress Notes (Signed)
   04/19/20 1025  OBSTRUCTIVE SLEEP APNEA  Have you ever been diagnosed with sleep apnea through a sleep study? No  Do you snore loudly (loud enough to be heard through closed doors)?  1  Do you often feel tired, fatigued, or sleepy during the daytime (such as falling asleep during driving or talking to someone)? 0  Has anyone observed you stop breathing during your sleep? 0  Do you have, or are you being treated for high blood pressure? 1  BMI more than 35 kg/m2? 1  Age > 50 (1-yes) 1  Neck circumference greater than:Male 16 inches or larger, Male 17inches or larger? 0  Male Gender (Yes=1) 1  Obstructive Sleep Apnea Score 5  Score 5 or greater  Results sent to PCP

## 2020-04-23 ENCOUNTER — Other Ambulatory Visit (HOSPITAL_COMMUNITY)
Admission: RE | Admit: 2020-04-23 | Discharge: 2020-04-23 | Disposition: A | Discharge status: Home / Self Care | Payer: Medicare Other | Source: Ambulatory Visit | Attending: Orthopedic Surgery | Admitting: Orthopedic Surgery

## 2020-04-23 DIAGNOSIS — Z20822 Contact with and (suspected) exposure to covid-19: Secondary | ICD-10-CM | POA: Diagnosis not present

## 2020-04-23 DIAGNOSIS — Z01812 Encounter for preprocedural laboratory examination: Secondary | ICD-10-CM | POA: Diagnosis present

## 2020-04-23 LAB — SARS CORONAVIRUS 2 (TAT 6-24 HRS): SARS Coronavirus 2: NEGATIVE

## 2020-04-25 MED ORDER — DEXTROSE 5 % IV SOLN
3.0000 g | INTRAVENOUS | Status: AC
  Administered 2020-04-26: 3 g via INTRAVENOUS
  Filled 2020-04-25: qty 3

## 2020-04-26 ENCOUNTER — Other Ambulatory Visit: Payer: Self-pay

## 2020-04-26 ENCOUNTER — Ambulatory Visit (HOSPITAL_COMMUNITY): Payer: Medicare Other | Admitting: Certified Registered Nurse Anesthetist

## 2020-04-26 ENCOUNTER — Encounter (HOSPITAL_COMMUNITY): Payer: Self-pay | Admitting: Orthopedic Surgery

## 2020-04-26 ENCOUNTER — Encounter (HOSPITAL_COMMUNITY)
Admission: RE | Disposition: A | Discharge status: Home / Self Care | Payer: Self-pay | Source: Ambulatory Visit | Attending: Orthopedic Surgery

## 2020-04-26 ENCOUNTER — Ambulatory Visit (HOSPITAL_COMMUNITY)
Admission: RE | Admit: 2020-04-26 | Discharge: 2020-04-26 | Disposition: A | Discharge status: Home / Self Care | Payer: Medicare Other | Source: Ambulatory Visit | Attending: Orthopedic Surgery | Admitting: Orthopedic Surgery

## 2020-04-26 ENCOUNTER — Ambulatory Visit (HOSPITAL_COMMUNITY): Payer: Medicare Other

## 2020-04-26 DIAGNOSIS — M4803 Spinal stenosis, cervicothoracic region: Secondary | ICD-10-CM | POA: Insufficient documentation

## 2020-04-26 DIAGNOSIS — Z85828 Personal history of other malignant neoplasm of skin: Secondary | ICD-10-CM | POA: Diagnosis not present

## 2020-04-26 DIAGNOSIS — Z79899 Other long term (current) drug therapy: Secondary | ICD-10-CM | POA: Insufficient documentation

## 2020-04-26 DIAGNOSIS — Z9884 Bariatric surgery status: Secondary | ICD-10-CM | POA: Diagnosis not present

## 2020-04-26 DIAGNOSIS — E119 Type 2 diabetes mellitus without complications: Secondary | ICD-10-CM | POA: Diagnosis not present

## 2020-04-26 DIAGNOSIS — M5412 Radiculopathy, cervical region: Secondary | ICD-10-CM | POA: Insufficient documentation

## 2020-04-26 DIAGNOSIS — I10 Essential (primary) hypertension: Secondary | ICD-10-CM | POA: Insufficient documentation

## 2020-04-26 DIAGNOSIS — Z7984 Long term (current) use of oral hypoglycemic drugs: Secondary | ICD-10-CM | POA: Insufficient documentation

## 2020-04-26 DIAGNOSIS — Z87891 Personal history of nicotine dependence: Secondary | ICD-10-CM | POA: Insufficient documentation

## 2020-04-26 DIAGNOSIS — Z96652 Presence of left artificial knee joint: Secondary | ICD-10-CM | POA: Insufficient documentation

## 2020-04-26 DIAGNOSIS — M4802 Spinal stenosis, cervical region: Secondary | ICD-10-CM | POA: Insufficient documentation

## 2020-04-26 DIAGNOSIS — Z419 Encounter for procedure for purposes other than remedying health state, unspecified: Secondary | ICD-10-CM

## 2020-04-26 HISTORY — PX: POSTERIOR CERVICAL FUSION/FORAMINOTOMY: SHX5038

## 2020-04-26 LAB — GLUCOSE, CAPILLARY
Glucose-Capillary: 208 mg/dL — ABNORMAL HIGH (ref 70–99)
Glucose-Capillary: 177 mg/dL — ABNORMAL HIGH (ref 70–99)
Glucose-Capillary: 221 mg/dL — ABNORMAL HIGH (ref 70–99)

## 2020-04-26 SURGERY — POSTERIOR CERVICAL FUSION/FORAMINOTOMY LEVEL 4
Anesthesia: General

## 2020-04-26 MED ORDER — ONDANSETRON HCL 4 MG/2ML IJ SOLN
INTRAMUSCULAR | Status: AC
  Filled 2020-04-26: qty 2

## 2020-04-26 MED ORDER — OXYCODONE HCL 5 MG PO TABS
5.0000 mg | ORAL_TABLET | Freq: Once | ORAL | Status: AC | PRN
  Administered 2020-04-26: 5 mg via ORAL

## 2020-04-26 MED ORDER — ACETAMINOPHEN 500 MG PO TABS
ORAL_TABLET | ORAL | Status: AC
  Administered 2020-04-26: 1000 mg via ORAL
  Filled 2020-04-26: qty 2

## 2020-04-26 MED ORDER — CELECOXIB 200 MG PO CAPS: 200.0000 mg | ORAL_CAPSULE | Freq: Once | ORAL | Status: AC

## 2020-04-26 MED ORDER — ACETAMINOPHEN 500 MG PO TABS: 1000.0000 mg | ORAL_TABLET | Freq: Once | ORAL | Status: DC | PRN

## 2020-04-26 MED ORDER — MIDAZOLAM HCL 2 MG/2ML IJ SOLN
INTRAMUSCULAR | Status: AC
  Filled 2020-04-26: qty 2

## 2020-04-26 MED ORDER — CELECOXIB 200 MG PO CAPS
ORAL_CAPSULE | ORAL | Status: AC
  Administered 2020-04-26: 200 mg via ORAL
  Filled 2020-04-26: qty 1

## 2020-04-26 MED ORDER — BACITRACIN ZINC 500 UNIT/GM EX OINT
TOPICAL_OINTMENT | CUTANEOUS | Status: DC | PRN
  Administered 2020-04-26: 1 via TOPICAL

## 2020-04-26 MED ORDER — FENTANYL CITRATE (PF) 100 MCG/2ML IJ SOLN: 25.0000 ug | INTRAMUSCULAR | Status: DC | PRN

## 2020-04-26 MED ORDER — OXYCODONE HCL 5 MG/5ML PO SOLN: 5.0000 mg | Freq: Once | ORAL | Status: AC | PRN

## 2020-04-26 MED ORDER — PROPOFOL 10 MG/ML IV BOLUS
INTRAVENOUS | Status: AC
  Filled 2020-04-26: qty 20

## 2020-04-26 MED ORDER — PHENYLEPHRINE HCL (PRESSORS) 10 MG/ML IV SOLN
INTRAVENOUS | Status: AC
  Filled 2020-04-26: qty 1

## 2020-04-26 MED ORDER — ROCURONIUM BROMIDE 10 MG/ML (PF) SYRINGE
PREFILLED_SYRINGE | INTRAVENOUS | Status: AC
  Filled 2020-04-26: qty 10

## 2020-04-26 MED ORDER — PHENYLEPHRINE HCL-NACL 10-0.9 MG/250ML-% IV SOLN
INTRAVENOUS | Status: DC | PRN
  Administered 2020-04-26: 50 ug/min via INTRAVENOUS

## 2020-04-26 MED ORDER — THROMBIN 20000 UNITS EX SOLR: CUTANEOUS | Status: DC | PRN

## 2020-04-26 MED ORDER — ONDANSETRON HCL 4 MG/2ML IJ SOLN
INTRAMUSCULAR | Status: DC | PRN
  Administered 2020-04-26: 4 mg via INTRAVENOUS

## 2020-04-26 MED ORDER — METHOCARBAMOL 500 MG PO TABS: 500.0000 mg | ORAL_TABLET | Freq: Four times a day (QID) | ORAL | 0 refills | Status: DC | PRN

## 2020-04-26 MED ORDER — PHENYLEPHRINE 40 MCG/ML (10ML) SYRINGE FOR IV PUSH (FOR BLOOD PRESSURE SUPPORT)
PREFILLED_SYRINGE | INTRAVENOUS | Status: DC | PRN
  Administered 2020-04-26: 80 ug via INTRAVENOUS
  Administered 2020-04-26 (×2): 120 ug via INTRAVENOUS

## 2020-04-26 MED ORDER — ACETAMINOPHEN 160 MG/5ML PO SOLN: 1000.0000 mg | Freq: Once | ORAL | Status: DC | PRN

## 2020-04-26 MED ORDER — LIDOCAINE 2% (20 MG/ML) 5 ML SYRINGE
INTRAMUSCULAR | Status: DC | PRN
  Administered 2020-04-26: 100 mg via INTRAVENOUS

## 2020-04-26 MED ORDER — ACETAMINOPHEN 10 MG/ML IV SOLN: 1000.0000 mg | Freq: Once | INTRAVENOUS | Status: DC | PRN

## 2020-04-26 MED ORDER — DEXAMETHASONE SODIUM PHOSPHATE 10 MG/ML IJ SOLN
INTRAMUSCULAR | Status: DC | PRN
  Administered 2020-04-26: 4 mg via INTRAVENOUS

## 2020-04-26 MED ORDER — THROMBIN 20000 UNITS EX SOLR
CUTANEOUS | Status: AC
  Filled 2020-04-26: qty 20000

## 2020-04-26 MED ORDER — BACITRACIN ZINC 500 UNIT/GM EX OINT
TOPICAL_OINTMENT | CUTANEOUS | Status: AC
  Filled 2020-04-26: qty 28.35

## 2020-04-26 MED ORDER — METHYLPREDNISOLONE ACETATE 80 MG/ML IJ SUSP
INTRAMUSCULAR | Status: DC | PRN
  Administered 2020-04-26: 40 mg

## 2020-04-26 MED ORDER — BUPIVACAINE LIPOSOME 1.3 % IJ SUSP
20.0000 mL | Freq: Once | INTRAMUSCULAR | Status: DC
  Filled 2020-04-26: qty 20

## 2020-04-26 MED ORDER — POVIDONE-IODINE 7.5 % EX SOLN: Freq: Once | CUTANEOUS | Status: DC

## 2020-04-26 MED ORDER — DEXAMETHASONE SODIUM PHOSPHATE 10 MG/ML IJ SOLN
INTRAMUSCULAR | Status: AC
  Filled 2020-04-26: qty 1

## 2020-04-26 MED ORDER — SUFENTANIL CITRATE 50 MCG/ML IV SOLN
INTRAVENOUS | Status: AC
  Filled 2020-04-26: qty 1

## 2020-04-26 MED ORDER — OXYCODONE-ACETAMINOPHEN 5-325 MG PO TABS: 1.0000 | ORAL_TABLET | Freq: Four times a day (QID) | ORAL | 0 refills | Status: AC | PRN

## 2020-04-26 MED ORDER — SUFENTANIL CITRATE 50 MCG/ML IV SOLN
INTRAVENOUS | Status: DC | PRN
  Administered 2020-04-26: 15 ug via INTRAVENOUS
  Administered 2020-04-26: 10 ug via INTRAVENOUS

## 2020-04-26 MED ORDER — ROCURONIUM BROMIDE 10 MG/ML (PF) SYRINGE
PREFILLED_SYRINGE | INTRAVENOUS | Status: DC | PRN
  Administered 2020-04-26: 20 mg via INTRAVENOUS
  Administered 2020-04-26: 40 mg via INTRAVENOUS
  Administered 2020-04-26 (×2): 10 mg via INTRAVENOUS

## 2020-04-26 MED ORDER — SUCCINYLCHOLINE CHLORIDE 200 MG/10ML IV SOSY
PREFILLED_SYRINGE | INTRAVENOUS | Status: DC | PRN
  Administered 2020-04-26: 140 mg via INTRAVENOUS

## 2020-04-26 MED ORDER — LIDOCAINE 2% (20 MG/ML) 5 ML SYRINGE
INTRAMUSCULAR | Status: AC
  Filled 2020-04-26: qty 5

## 2020-04-26 MED ORDER — BUPIVACAINE-EPINEPHRINE 0.25% -1:200000 IJ SOLN
INTRAMUSCULAR | Status: DC | PRN
  Administered 2020-04-26: 30 mL

## 2020-04-26 MED ORDER — PROPOFOL 10 MG/ML IV BOLUS
INTRAVENOUS | Status: DC | PRN
  Administered 2020-04-26: 120 mg via INTRAVENOUS
  Administered 2020-04-26: 50 mg via INTRAVENOUS

## 2020-04-26 MED ORDER — ACETAMINOPHEN 500 MG PO TABS: 1000.0000 mg | ORAL_TABLET | Freq: Once | ORAL | Status: AC

## 2020-04-26 MED ORDER — MIDAZOLAM HCL 5 MG/5ML IJ SOLN
INTRAMUSCULAR | Status: DC | PRN
  Administered 2020-04-26: 2 mg via INTRAVENOUS

## 2020-04-26 MED ORDER — ORAL CARE MOUTH RINSE: 15.0000 mL | Freq: Once | OROMUCOSAL | Status: AC

## 2020-04-26 MED ORDER — METHYLPREDNISOLONE ACETATE 80 MG/ML IJ SUSP
INTRAMUSCULAR | Status: AC
  Filled 2020-04-26: qty 1

## 2020-04-26 MED ORDER — LACTATED RINGERS IV SOLN: INTRAVENOUS | Status: DC

## 2020-04-26 MED ORDER — OXYCODONE HCL 5 MG PO TABS
ORAL_TABLET | ORAL | Status: AC
  Filled 2020-04-26: qty 1

## 2020-04-26 MED ORDER — BUPIVACAINE LIPOSOME 1.3 % IJ SUSP
INTRAMUSCULAR | Status: DC | PRN
  Administered 2020-04-26: 20 mL

## 2020-04-26 MED ORDER — CHLORHEXIDINE GLUCONATE 0.12 % MT SOLN
15.0000 mL | Freq: Once | OROMUCOSAL | Status: AC
  Administered 2020-04-26: 15 mL via OROMUCOSAL
  Filled 2020-04-26: qty 15

## 2020-04-26 SURGICAL SUPPLY — 76 items
BENZOIN TINCTURE PRP APPL 2/3 (GAUZE/BANDAGES/DRESSINGS) ×3 IMPLANT
BLADE CLIPPER SURG NEURO (BLADE) IMPLANT
BUR NEURO DRILL SOFT 3.0X3.8M (BURR) ×3 IMPLANT
BUR PRESCISION 1.7 ELITE (BURR) ×3 IMPLANT
CLOSURE WOUND 1/2 X4 (GAUZE/BANDAGES/DRESSINGS) ×1
CNTNR URN SCR LID CUP LEK RST (MISCELLANEOUS) ×1 IMPLANT
COLLAR CERV LO CONTOUR FIRM DE (SOFTGOODS) ×3 IMPLANT
CONT SPEC 4OZ STRL OR WHT (MISCELLANEOUS) ×2
CORD BIPOLAR FORCEPS 12FT (ELECTRODE) ×3 IMPLANT
COVER BACK TABLE 80X110 HD (DRAPES) ×3 IMPLANT
COVER SURGICAL LIGHT HANDLE (MISCELLANEOUS) IMPLANT
COVER WAND RF STERILE (DRAPES) ×3 IMPLANT
DRAIN CHANNEL 10F 3/8 F FF (DRAIN) IMPLANT
DRAIN CHANNEL 15F RND FF W/TCR (WOUND CARE) IMPLANT
DRAIN HEMOVAC 1/8 X 5 (WOUND CARE) IMPLANT
DRAPE C-ARM 42X72 X-RAY (DRAPES) IMPLANT
DRAPE HALF SHEET 40X57 (DRAPES) ×15 IMPLANT
DRAPE INCISE IOBAN 66X45 STRL (DRAPES) IMPLANT
DRAPE LAPAROTOMY 100X72 PEDS (DRAPES) ×3 IMPLANT
DRAPE POUCH INSTRU U-SHP 10X18 (DRAPES) ×3 IMPLANT
DRAPE SURG 17X23 STRL (DRAPES) ×12 IMPLANT
DRSG MEPILEX BORDER 4X8 (GAUZE/BANDAGES/DRESSINGS) ×3 IMPLANT
DURAPREP 26ML APPLICATOR (WOUND CARE) ×3 IMPLANT
ELECT BLADE 4.0 EZ CLEAN MEGAD (MISCELLANEOUS) ×3
ELECT CAUTERY BLADE 6.4 (BLADE) ×3 IMPLANT
ELECT REM PT RETURN 9FT ADLT (ELECTROSURGICAL) ×3
ELECTRODE BLDE 4.0 EZ CLN MEGD (MISCELLANEOUS) ×1 IMPLANT
ELECTRODE REM PT RTRN 9FT ADLT (ELECTROSURGICAL) ×1 IMPLANT
EVACUATOR SILICONE 100CC (DRAIN) IMPLANT
GAUZE 4X4 16PLY RFD (DISPOSABLE) ×6 IMPLANT
GAUZE SPONGE 4X4 12PLY STRL (GAUZE/BANDAGES/DRESSINGS) ×3 IMPLANT
GLOVE BIO SURGEON STRL SZ7 (GLOVE) ×12 IMPLANT
GLOVE BIO SURGEON STRL SZ8 (GLOVE) ×3 IMPLANT
GLOVE BIOGEL PI IND STRL 7.5 (GLOVE) ×1 IMPLANT
GLOVE BIOGEL PI IND STRL 8 (GLOVE) ×1 IMPLANT
GLOVE BIOGEL PI INDICATOR 7.5 (GLOVE) ×2
GLOVE BIOGEL PI INDICATOR 8 (GLOVE) ×2
GOWN STRL REUS W/ TWL LRG LVL3 (GOWN DISPOSABLE) ×2 IMPLANT
GOWN STRL REUS W/ TWL XL LVL3 (GOWN DISPOSABLE) ×1 IMPLANT
GOWN STRL REUS W/TWL LRG LVL3 (GOWN DISPOSABLE) ×4
GOWN STRL REUS W/TWL XL LVL3 (GOWN DISPOSABLE) ×2
IV CATH 14GX2 1/4 (CATHETERS) ×3 IMPLANT
KIT BASIN OR (CUSTOM PROCEDURE TRAY) ×3 IMPLANT
KIT TURNOVER KIT B (KITS) ×3 IMPLANT
NEEDLE 18GX1X1/2 (RX/OR ONLY) (NEEDLE) ×3 IMPLANT
NEEDLE HYPO 25GX1X1/2 BEV (NEEDLE) ×3 IMPLANT
NEEDLE PRECISIONGLIDE 27X1.5 (NEEDLE) ×3 IMPLANT
NS IRRIG 1000ML POUR BTL (IV SOLUTION) ×3 IMPLANT
PACK LAMINECTOMY ORTHO (CUSTOM PROCEDURE TRAY) ×3 IMPLANT
PACK UNIVERSAL I (CUSTOM PROCEDURE TRAY) ×3 IMPLANT
PAD ARMBOARD 7.5X6 YLW CONV (MISCELLANEOUS) ×6 IMPLANT
PATTIES SURGICAL .5 X.5 (GAUZE/BANDAGES/DRESSINGS) ×3 IMPLANT
PATTIES SURGICAL .5 X3 (DISPOSABLE) IMPLANT
PATTIES SURGICAL .5X1.5 (GAUZE/BANDAGES/DRESSINGS) ×3 IMPLANT
PIN MAYFIELD SKULL DISP (PIN) ×3 IMPLANT
SPONGE INTESTINAL PEANUT (DISPOSABLE) ×3 IMPLANT
SPONGE SURGIFOAM ABS GEL 100 (HEMOSTASIS) ×3 IMPLANT
STRIP CLOSURE SKIN 1/2X4 (GAUZE/BANDAGES/DRESSINGS) ×2 IMPLANT
SURGIFLO W/THROMBIN 8M KIT (HEMOSTASIS) IMPLANT
SUT MNCRL AB 4-0 PS2 18 (SUTURE) ×6 IMPLANT
SUT VIC AB 0 CT1 18XCR BRD 8 (SUTURE) ×1 IMPLANT
SUT VIC AB 0 CT1 8-18 (SUTURE) ×2
SUT VIC AB 1 CT1 18XCR BRD 8 (SUTURE) ×1 IMPLANT
SUT VIC AB 1 CT1 8-18 (SUTURE) ×2
SUT VIC AB 2-0 CT2 18 VCP726D (SUTURE) ×9 IMPLANT
SYR 20ML LL LF (SYRINGE) ×3 IMPLANT
SYR BULB IRRIG 60ML STRL (SYRINGE) ×3 IMPLANT
SYR CONTROL 10ML LL (SYRINGE) ×3 IMPLANT
SYR TB 1ML LUER SLIP (SYRINGE) ×3 IMPLANT
TAPE CLOTH 4X10 WHT NS (GAUZE/BANDAGES/DRESSINGS) ×3 IMPLANT
TAPE PAPER 3X10 WHT MICROPORE (GAUZE/BANDAGES/DRESSINGS) ×3 IMPLANT
TOWEL GREEN STERILE (TOWEL DISPOSABLE) ×3 IMPLANT
TOWEL GREEN STERILE FF (TOWEL DISPOSABLE) ×3 IMPLANT
TRAY FOLEY MTR SLVR 16FR STAT (SET/KITS/TRAYS/PACK) IMPLANT
WATER STERILE IRR 1000ML POUR (IV SOLUTION) ×3 IMPLANT
YANKAUER SUCT BULB TIP NO VENT (SUCTIONS) ×3 IMPLANT

## 2020-04-26 NOTE — H&P (Signed)
PREOPERATIVE H&P  Chief Complaint: Left arm pain  HPI: Max Ryan is a 74 y.o. male who presents with ongoing pain in the left arm, extending into his arm, forearm, and ulnar hand. ESI helped temporarily.   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 Cancer ([place])    skin cancer  . Diabetes mellitus without complication ([place])   . History of kidney stones   . Hypertension   . Sarcoidosis    Past Surgical History:  Procedure Laterality Date  . ANTERIOR CERVICAL DECOMPRESSION/DISCECTOMY FUSION 4 LEVELS N/A 08/17/2019   Procedure: ANTERIOR CERVICAL DECOMPRESSION FUSION CERVICAL THREE-FOUR, CERVICAL FOUR-FIVE, CERVICAL FIVE-SIX, CERVICAL SIX-SEVEN WITH INSTRUMENTATION AND ALLOGRAFT;  Surgeon: Phylliss Bob, MD;  Location: [facility];  Service: Orthopedics;  Laterality: N/A;  . CARPAL TUNNEL RELEASE Right   . CHOLECYSTECTOMY    . COLONOSCOPY    . CYSTOSCOPY/URETEROSCOPY/HOLMIUM LASER/STENT PLACEMENT Right 07/19/2019   Procedure: CYSTOSCOPY/URETEROSCOPY/HOLMIUM LASER/STENT PLACEMENT;  Surgeon: Ardis Hughs, MD;  [facility];  Service: Urology;  Laterality: Right;  . HERNIA REPAIR    . JOINT REPLACEMENT Left    knee  . KNEE SURGERY Left   . LAPAROSCOPIC ROUX-EN-Y GASTRIC BYPASS WITH UPPER ENDOSCOPY AND REMOVAL OF LAP BAND    . SPINAL FUSION    . TONSILLECTOMY     Social History   Socioeconomic History  . Marital status: Married    Spouse name: Not on file  . Number of children: Not on file  . Years of education: Not on file  . Highest education level: Not on file  Occupational History  . Not on file  Tobacco Use  . Smoking status: Former Smoker    Quit date: 2005    Years since quitting: 16.4  . Smokeless tobacco: Never Used  Vaping  Use  . Vaping Use: Never used  Substance and Sexual Activity  . Alcohol use: Not Currently    Alcohol/week: 42.0 standard drinks    Types: 42 Glasses of wine per week    Comment: "Haven't had a drink since Feb 2021" - per pt  . Drug use: Yes    Types: Marijuana    Comment: occ  . Sexual activity: Not on file  Other Topics Concern  . Not on file  Social History Narrative  . Not on file   Social Determinants of Health   Financial Resource Strain:   . Difficulty of Paying Living Expenses:   Food Insecurity:   . Worried About Charity fundraiser in the Last Year:   . Arboriculturist in the Last Year:   Transportation Needs:   . Film/video editor (Medical):   Max Ryan Lack of Transportation (Non-Medical):   Physical Activity:   . Days of Exercise per Week:   . Minutes of Exercise per Session:   Stress:   . Feeling of Stress :   Social Connections:   . Frequency of Communication with Friends and Family:   . Frequency of Social Gatherings with Friends and Family:   . Attends Religious Services:   . Active Member of Clubs or Organizations:   . Attends Archivist Meetings:   Max Ryan Marital Status:    History reviewed. No pertinent family history. No Known Allergies  Prior to Admission medications   Medication Sig Start Date End Date Taking? Authorizing Provider  acetaminophen-codeine (TYLENOL #3) 300-30 MG tablet Take 1 tablet by mouth every 6 (six) hours as needed (pain.).  03/16/20  Yes [provider]  carbidopa-levodopa (SINEMET IR) 25-100 MG tablet Take 1 tablet by mouth at bedtime. 07/03/19  Yes [provider]  cyclobenzaprine (FLEXERIL) 5 MG tablet Take 5-10 mg by mouth at bedtime as needed for spasms. 03/30/20  Yes [provider]  gabapentin (NEURONTIN) 600 MG tablet Take 600 mg by mouth 2 (two) times daily.   Yes [provider]  losartan (COZAAR) 25 MG tablet Take 25 mg by mouth daily.   Yes [provider]  lovastatin  (MEVACOR) 10 MG tablet Take 10 mg by mouth daily.    Yes [provider]  metFORMIN (GLUCOPHAGE) 500 MG tablet Take 500 mg by mouth 2 (two) times daily.   Yes [provider]  tamsulosin (FLOMAX) 0.4 MG CAPS capsule Take 0.4 mg by mouth in the morning and at bedtime. 01/24/20  Yes [provider]     All other systems have been reviewed and were otherwise negative with the exception of those mentioned in the HPI and as above.  Physical Exam: Vitals:   04/26/20 0651  BP: (!) 158/84  Pulse: 73  Resp: 17  Temp: 98.6 F (37 C)  SpO2: 97%    Body mass index is 38.77 kg/m.  General: Alert, no acute distress Cardiovascular: No pedal edema Respiratory: No cyanosis, no use of accessory musculature Skin: No lesions in the area of chief complaint Neurologic: Sensation intact distally Psychiatric: Patient is competent for consent with normal mood and affect Lymphatic: No axillary or cervical lymphadenopathy  Assessment/Plan: CERVICAL RADICULOPATHY Plan for Procedure(s): POSTERIOR CERVICAL DECOMPRESSION CERVICAL 6 - THORACIC 1   [name], MD 04/26/2020 7:13 AM

## 2020-04-26 NOTE — Transfer of Care (Signed)
Immediate Anesthesia Transfer of Care Note  Patient: Max Ryan  Procedure(s) Performed: POSTERIOR CERVICAL DECOMPRESSION CERVICAL SIX- THORACIC ONE (N/A )  Patient Location: PACU  Anesthesia Type:General  Level of Consciousness: drowsy and patient cooperative  Airway & Oxygen Therapy: Patient Spontanous Breathing and Patient connected to nasal cannula oxygen  Post-op Assessment: Report given to RN, Post -op Vital signs reviewed and stable and Patient moving all extremities  Post vital signs: Reviewed and stable  Last Vitals:  Vitals Value Taken Time  BP 219/77 04/26/20 1339  Temp    Pulse 67 04/26/20 1340  Resp 14 04/26/20 1340  SpO2 96 % 04/26/20 1340  Vitals shown include unvalidated device data.  Last Pain:  Vitals:   04/26/20 0725  TempSrc:   PainSc: 5          Complications: No complications documented.

## 2020-04-26 NOTE — Anesthesia Postprocedure Evaluation (Signed)
Anesthesia Post Note  Patient: Max Ryan  Procedure(s) Performed: POSTERIOR CERVICAL DECOMPRESSION CERVICAL SIX- THORACIC ONE (N/A )     Patient location during evaluation: PACU Anesthesia Type: General Level of consciousness: sedated Pain management: pain level controlled Vital Signs Assessment: post-procedure vital signs reviewed and stable Respiratory status: spontaneous breathing and respiratory function stable Cardiovascular status: stable Postop Assessment: no apparent nausea or vomiting Anesthetic complications: no   No complications documented.  Last Vitals:  Vitals:   04/26/20 1415 04/26/20 1445  BP:    Pulse: 70 67  Resp: 17 13  Temp:  36.5 C  SpO2: 97% 93%    Last Pain:  Vitals:   04/26/20 1342  TempSrc:   PainSc: Asleep                 Krystle Polcyn DANIEL

## 2020-04-26 NOTE — Progress Notes (Signed)
CBG 221 / per dr singer, will not treat, pt will take home meds for this

## 2020-04-26 NOTE — Op Note (Signed)
PATIENT NAME: Chestnut RECORD NO.:   [id number]    DATE OF BIRTH: 10-18-1946    DATE OF PROCEDURE: 04/26/2020      OPERATIVE REPORT   PREOPERATIVE DIAGNOSES: 1.  Left-sided cervical radiculopathy 2.  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  Left-sided cervical radiculopathy 2.  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  Left-sided partial facetectomy and foraminotomies C6-7, C7-T1 2.  Cranial tong application and removal.  SURGEON:  Phylliss Bob, MD  ASSISTANT:  Pricilla Holm, PA-C   ANESTHESIA:  General endotracheal anesthesia.  COMPLICATIONS:  None.  DISPOSITION:  Stable.  ESTIMATED BLOOD LOSS:  Minimal.  INDICATIONS FOR SURGERY:  Briefly, the patient is a pleasant 74 year old male who did previously undergo an anterior cervical fusion.  He did well from surgery, but did subsequently develop pain and weakness in the left arm.  Recent imaging did reveal neuroforaminal narrowing on the left at C6-7 and C7-T1.  I did feel that th these findings ing did correlate to his symptoms.  We did proceed with nonoperative measures, including an epidural injection, which did help him temporarily.  However, he did continue to have ongoing pain and weakness in the left arm.  Given this, we did discuss the risks and benefits and recovery period associated with the decompressive procedure.  He did wish to proceed. The patient was fully aware of the risks and limitations of surgery and did wish to proceed.  OPERATIVE DETAILS:  On 04/26/2020, the patient was brought to surgery and general endotracheal anesthesia was administered.  A [place] head holder was applied by me.  The patient was then rolled prone, and the [place] headholder was secured to the bed with the head positioned into the appropriate degree of lordosis.  The patient's arms were secured to his sides, and all bony prominences were padded.  The neck was then  prepped and draped posteriorly in the usual fashion.  A timeout procedure was performed.  At this point, I did make a midline incision.  The fascia was incised at the midline.  The paraspinal musculature was bluntly swept laterally, and the posterior elements of C6, C7, and T1 were identified and subperiosteally exposed.  I did take a total of 2 lateral intraoperative radiographs to confirm the appropriate operative levels.  One was taken at the beginning of the case, and one was taken at the termination of the case. I then turned my attention to the left C6-7 facet joint.  A 3 mm high-speed bur ,and Kerrison punches, were used to perform a partial facetectomy. The exiting left C7 nerve was palpated using a nerve hook.  I was able to thoroughly decompress the exiting left C7 nerve well past the C7 pedicle.  Gelfoam was placed over the laminotomy site.  I then turned my attention to the C7-T1 level.  Once again, using a high-speed bur and Kerrison punches, a partial facetectomy was performed.  This level was particularly stenotic, with substantial pressure and tension noted on the exiting left C8 nerve.  I was however able to thoroughly decompress the exiting left C8 nerve past the T1 pedicle.  I was very pleased with the decompression at both levels.  Once again, Gelfoam was placed over the laminotomy site.  I then applied approximately 40 mg of Depo-Medrol in the region of the laminotomy at C6-7 and C7-T1. The wound was then copiously irrigated using 1 L of normal saline.  At this point, the wound  was closed in layers using #1 Vicryl, followed by 2-0 Vicryl, followed by 4-0 Monocryl.  Benzoin and Steri-Strips were applied, followed by sterile dressing.  All instrument counts were correct at the termination of the procedure.  The patient was then rolled supine onto a hospital bed and the [place] head holder was removed.  Of note, Pricilla Holm was my assistant throughout surgery and did aid in retraction,  suctioning, and closure from start to finish.   Phylliss Bob, MD

## 2020-04-26 NOTE — Anesthesia Procedure Notes (Signed)
Procedure Name: Intubation Date/Time: 04/26/2020 10:36 AM Performed by: Moshe Salisbury, CRNA Pre-anesthesia Checklist: Patient identified, Emergency Drugs available, Suction available and Patient being monitored Patient Re-evaluated:Patient Re-evaluated prior to induction Oxygen Delivery Method: Circle System Utilized Preoxygenation: Pre-oxygenation with 100% oxygen Induction Type: IV induction Ventilation: Mask ventilation without difficulty Laryngoscope Size: Glidescope and 4 Tube type: Oral Tube size: 7.5 mm Number of attempts: 1 Airway Equipment and Method: Rigid stylet and Video-laryngoscopy Placement Confirmation: ETT inserted through vocal cords under direct vision,  positive ETCO2 and breath sounds checked- equal and bilateral Secured at: 22 cm Tube secured with: Tape Dental Injury: Teeth and Oropharynx as per pre-operative assessment

## 2020-04-26 NOTE — Anesthesia Preprocedure Evaluation (Addendum)
Anesthesia Evaluation  Patient identified by MRN, date of birth, ID band Patient awake    Reviewed: Allergy & Precautions, NPO status , Patient's Chart, lab work & pertinent test results  History of Anesthesia Complications Negative for: history of anesthetic complications  Airway Mallampati: III  TM Distance: >3 FB   Mouth opening: Limited Mouth Opening  Dental  (+) Teeth Intact, Dental Advisory Given   Pulmonary former smoker,  08/15/2019 SARS coronavirus neg H/o sarcoid   breath sounds clear to auscultation       Cardiovascular hypertension, Pt. on medications  Rhythm:Regular Rate:Normal     Neuro/Psych Parkinson's negative psych ROS   GI/Hepatic Neg liver ROS, S/p gastric bypass   Endo/Other  diabetes, Oral Hypoglycemic AgentsObese  Renal/GU negative Renal ROS     Musculoskeletal   Abdominal (+) + obese,   Peds  Hematology   Anesthesia Other Findings   Reproductive/Obstetrics                            Anesthesia Physical  Anesthesia Plan  ASA: III  Anesthesia Plan: General   Post-op Pain Management:    Induction: Intravenous  PONV Risk Score and Plan: 3 and Ondansetron, Dexamethasone and Midazolam  Airway Management Planned: Oral ETT  Additional Equipment:   Intra-op Plan:   Post-operative Plan: Extubation in OR  Informed Consent: I have reviewed the patients History and Physical, chart, labs and discussed the procedure including the risks, benefits and alternatives for the proposed anesthesia with the patient or authorized representative who has indicated his/her understanding and acceptance.     Dental advisory given  Plan Discussed with:   Anesthesia Plan Comments: ( TTE 2007 (care everywhere): Conclusions:  1)The Right Ventricular Systolic Pressure is calculated at 38 mmHG. 2)The left ventricular chamber size is slightly dilated. 3)Estimated left  ventricular ejection fraction is 60-65%. 4)Grade I left ventricular diastolic dysfunction. 5)The right ventricle is slightly enlarged. 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   Spirometry 2007 (care everywhere):  FVC of 3.49 liters, which is 83% of predicted. FEV1 was 2.90 liters, which is 95% of predicted.  FEV1/FVC ratio was 83%.  MVV was normal. LUNG VOLUMES: Total lung capacity of 5.91 liters, which is 99% of predicted. The rest of the lung volumes were normal. DIFFUSING CAPACITY: DLco of 22.7, which is 83% of predicted.  INTERPRETATION: This is essentially a normal pulmonary function study.     )        Anesthesia Quick Evaluation

## 2020-04-27 ENCOUNTER — Encounter (HOSPITAL_COMMUNITY): Payer: Self-pay | Admitting: Orthopedic Surgery

## 2021-03-01 IMAGING — CT CT ABD-PELV W/ CM
2 of 8 series · 12 of 46 positions shown, 14 images · IV contrast (omnipaque)
Comparison: None.

CLINICAL DATA: Right lower quadrant abdominal pain for the past 2
hours. Nausea and 1 episode of vomiting.

EXAM:
CT ABDOMEN AND PELVIS WITH CONTRAST
TECHNIQUE: Multidetector CT imaging of the abdomen and pelvis was performed
using the standard protocol following bolus administration of
intravenous contrast.
CONTRAST:  100mL OMNIPAQUE IOHEXOL 300 MG/ML  SOLN

[Series 2: axial st · axial · 0.98mm/px · z∈[+950,+1380]mm · 9 of 102 slices shown, 11 images]
[im 8/102  soft-tissue]
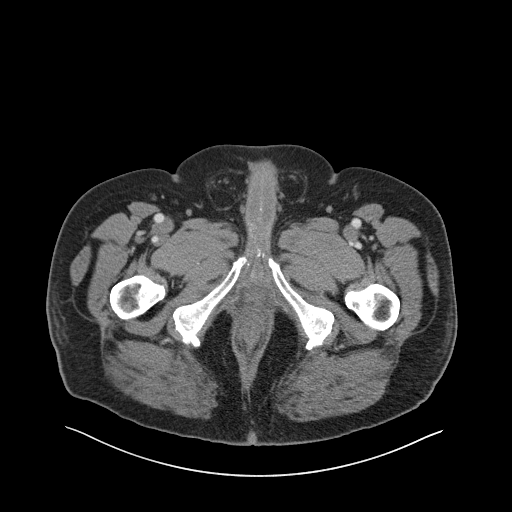
[im 8/102  bone]
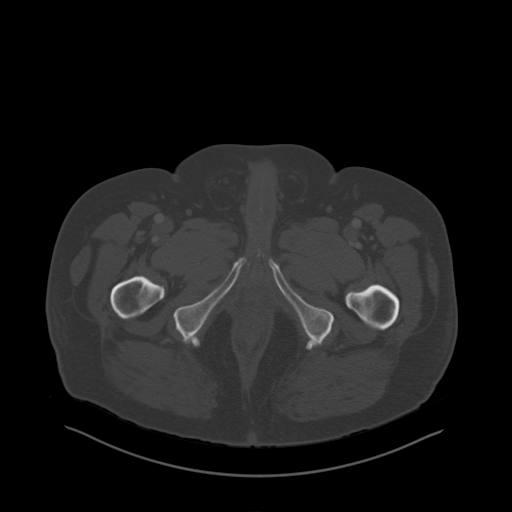
[im 24/102  soft-tissue]
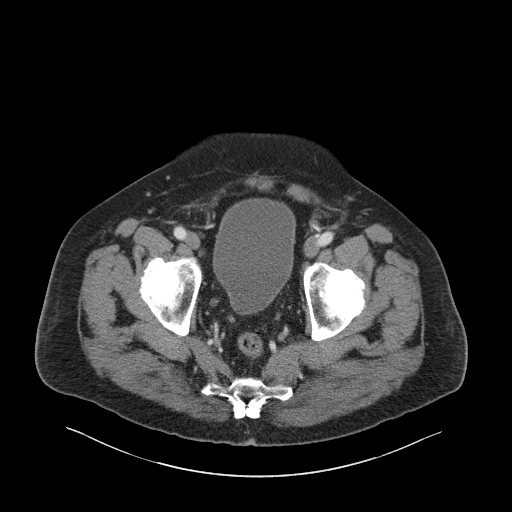
[im 32/102  soft-tissue]
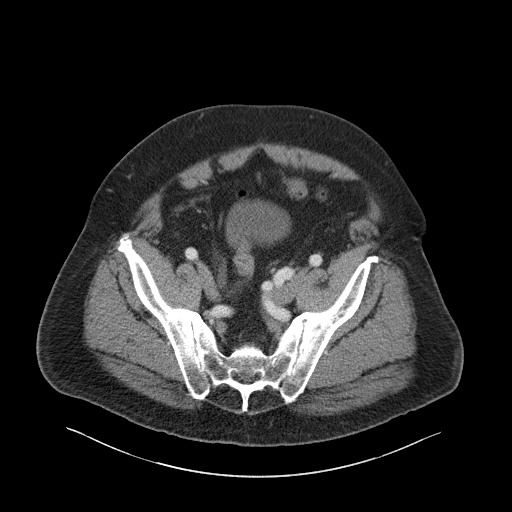
[im 39/102  soft-tissue]
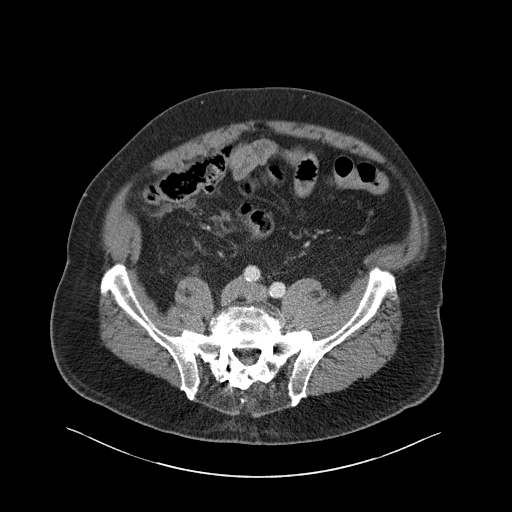
[im 55/102  soft-tissue]
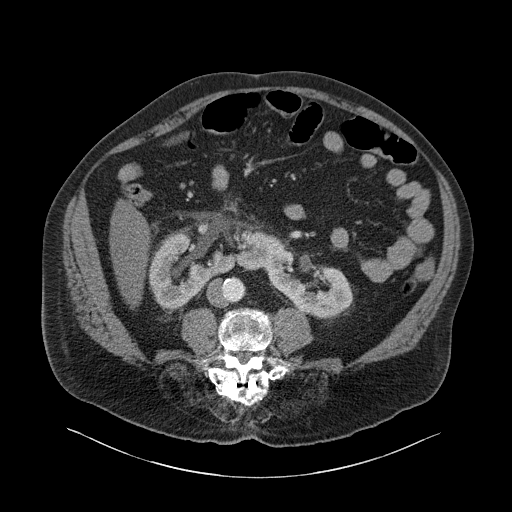
[im 63/102  soft-tissue]
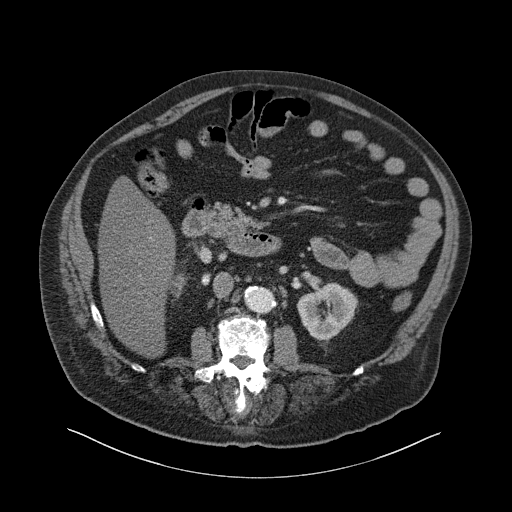
[im 70/102  soft-tissue]
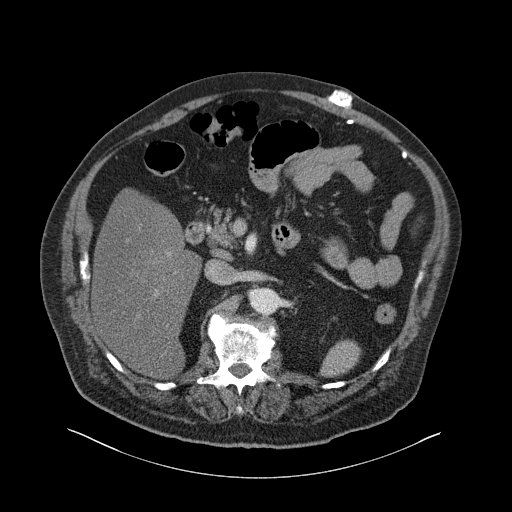
[im 86/102  soft-tissue]
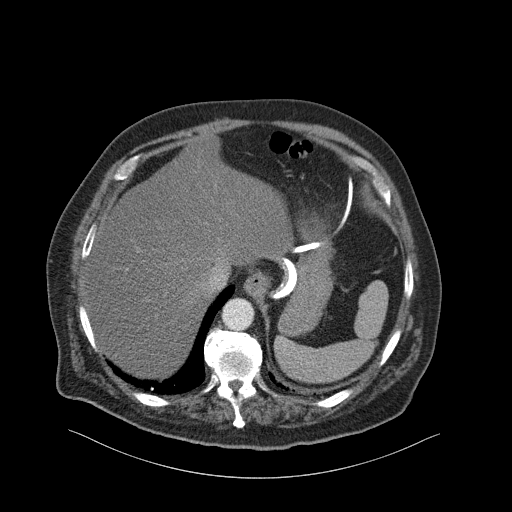
[im 94/102  soft-tissue]
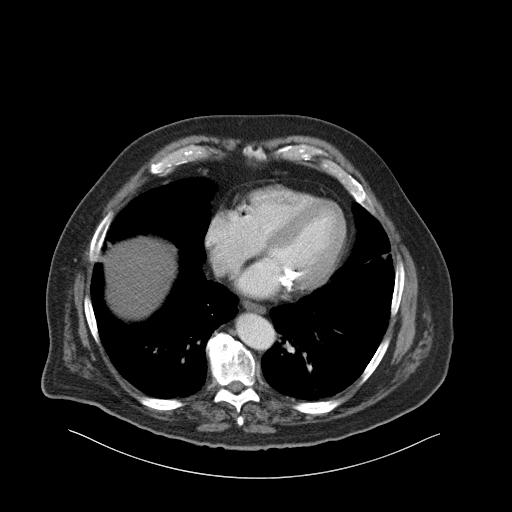
[im 94/102  bone]
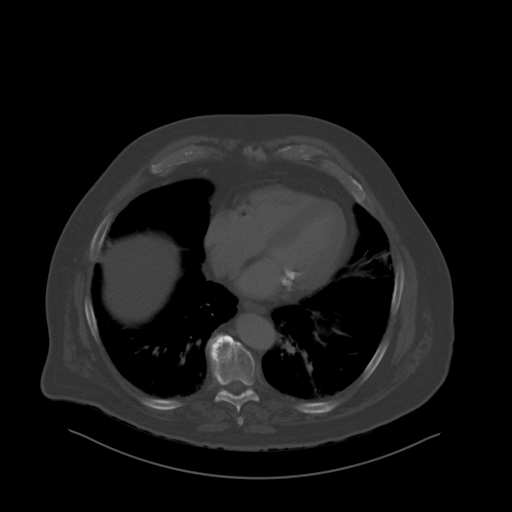

[Series 6: coronal st · coronal · 0.33mm/px · 3 of 172 slices shown]
[im 43/172  soft-tissue]
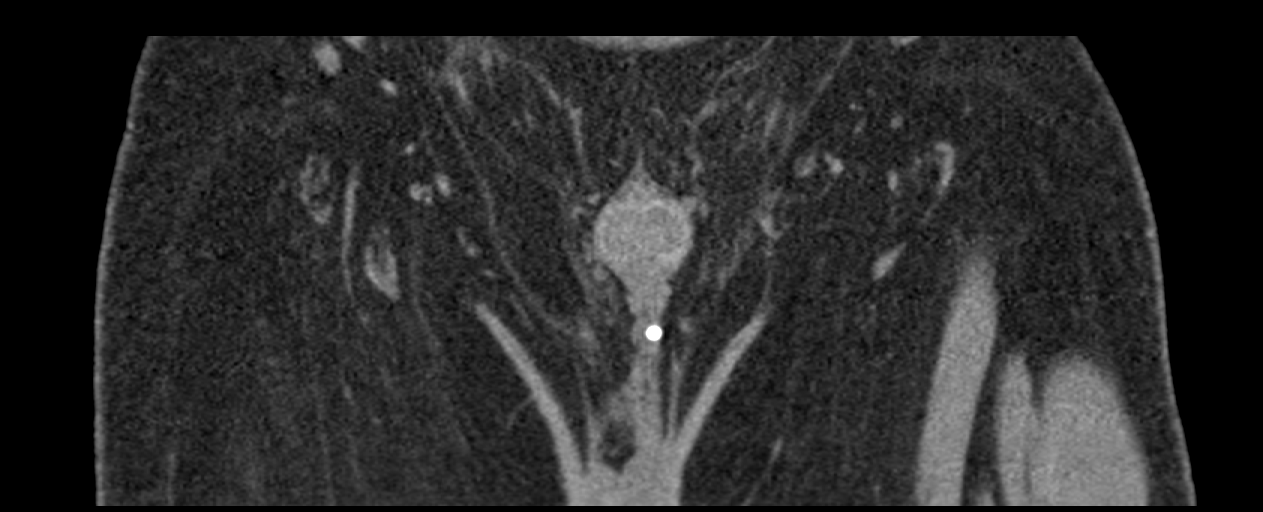
[im 86/172  soft-tissue]
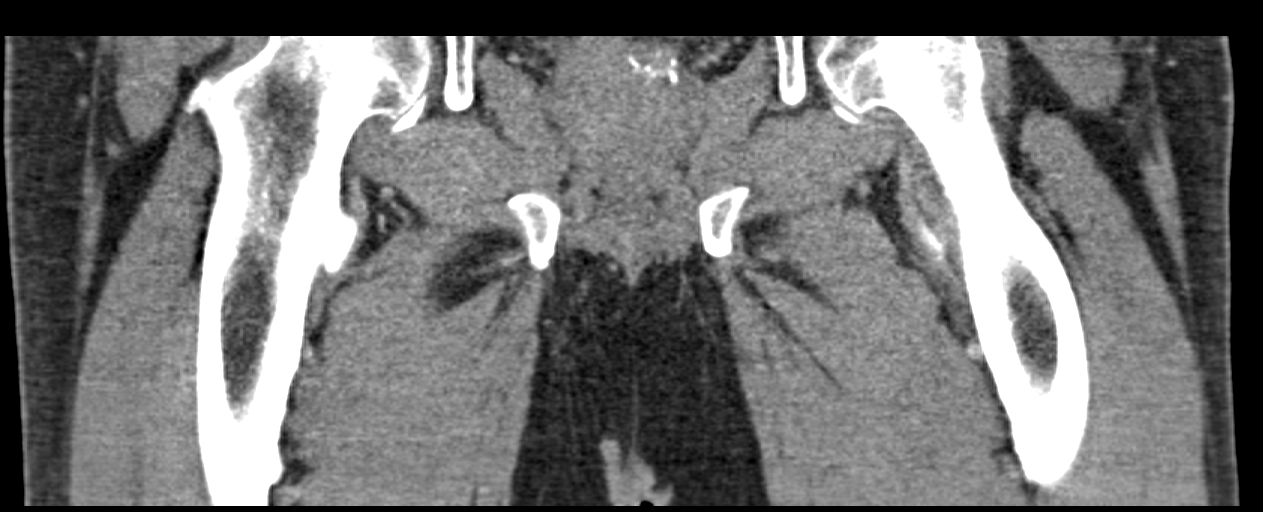
[im 129/172  soft-tissue]
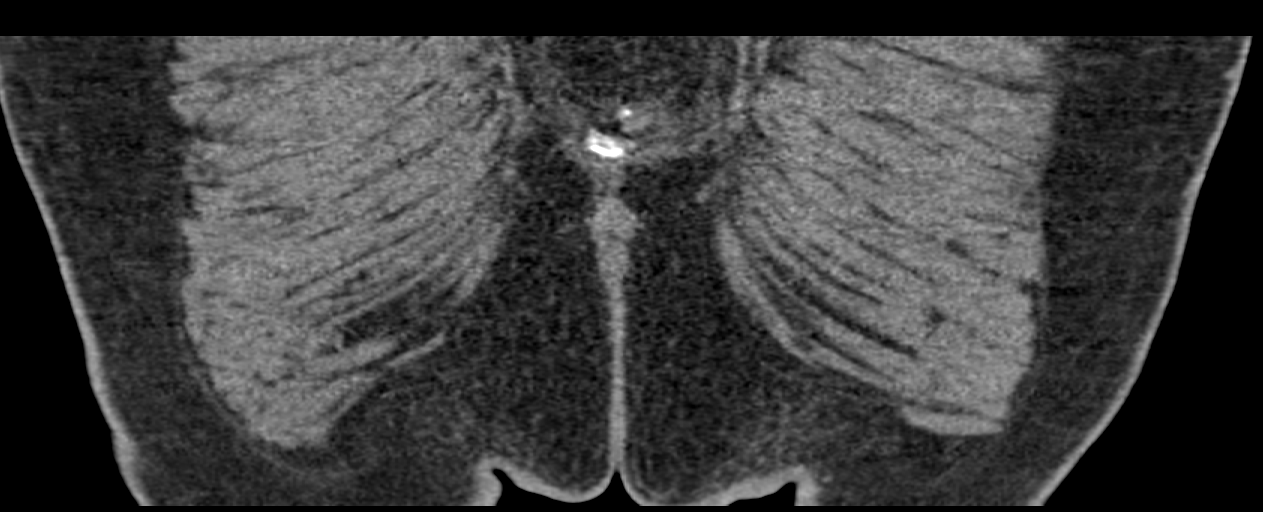

[12 of 46 positions shown; findings below may reference images not displayed]

FINDINGS: Lower chest: Atheromatous coronary artery calcifications.
Subsegmental atelectasis or scarring in the left lower lobe and
lingula. Small right lower lobe calcified granuloma.

Hepatobiliary: Marked diffuse low density of the liver relative to
the spleen. Cholecystectomy clips.

Pancreas: Unremarkable. No pancreatic ductal dilatation or
surrounding inflammatory changes.

Spleen: Normal in size without focal abnormality.

Adrenals/Urinary Tract: Horseshoe kidney with mild-to-moderate
hydronephrosis and hydroureter on the right, extending to the level
of the distal ureter. There is a 6 mm calculus in the distal ureter
just proximal to the ureterovesical junction and a 2 mm calculus at
the ureterovesical junction. There is significantly delayed contrast
excretion on the right, with no collecting system contrast on the
delayed images. Normal appearing collecting system and ureter on the
left. Unremarkable urinary bladder.

Stomach/Bowel: Proximal gastric band and associated tubing. Normal
appearing small bowel, colon and appendix.

Vascular/Lymphatic: Atheromatous arterial calcifications without
aneurysm. No enlarged lymph nodes.

Reproductive: Normal sized prostate gland containing coarse
calcifications.

Other: Small to moderate-sized bilateral inguinal hernias containing
fat. Tiny umbilical hernia containing fat.

Musculoskeletal: Interbody and pedicle screw and rod fixation at the
L3 through S1 levels. Lumbar and lower thoracic spine degenerative
changes.
IMPRESSION: 1. 6 mm and 2 mm distal right ureteral calculi, causing
mild-to-moderate right hydronephrosis and hydroureter.
2. Horseshoe kidney.
3. Marked diffuse hepatic steatosis.
4. Small to moderate-sized bilateral inguinal hernias containing
fat.

## 2021-04-25 ENCOUNTER — Ambulatory Visit: Payer: Medicare Other | Admitting: Internal Medicine

## 2021-04-30 ENCOUNTER — Ambulatory Visit: Payer: Medicare Other | Admitting: Internal Medicine

## 2021-04-30 ENCOUNTER — Encounter: Payer: Self-pay | Admitting: Internal Medicine

## 2021-04-30 ENCOUNTER — Other Ambulatory Visit: Payer: Self-pay

## 2021-04-30 VITALS — BP 138/76 | HR 62 | Ht 68.0 in | Wt 259.7 lb

## 2021-04-30 DIAGNOSIS — I2510 Atherosclerotic heart disease of native coronary artery without angina pectoris: Secondary | ICD-10-CM | POA: Diagnosis not present

## 2021-04-30 DIAGNOSIS — H349 Unspecified retinal vascular occlusion: Secondary | ICD-10-CM | POA: Diagnosis not present

## 2021-04-30 DIAGNOSIS — E782 Mixed hyperlipidemia: Secondary | ICD-10-CM

## 2021-04-30 DIAGNOSIS — K760 Fatty (change of) liver, not elsewhere classified: Secondary | ICD-10-CM | POA: Insufficient documentation

## 2021-04-30 DIAGNOSIS — E119 Type 2 diabetes mellitus without complications: Secondary | ICD-10-CM | POA: Insufficient documentation

## 2021-04-30 DIAGNOSIS — I10 Essential (primary) hypertension: Secondary | ICD-10-CM | POA: Diagnosis not present

## 2021-04-30 DIAGNOSIS — I2584 Coronary atherosclerosis due to calcified coronary lesion: Secondary | ICD-10-CM

## 2021-04-30 NOTE — Progress Notes (Signed)
OFFICE CONSULT NOTE  Chief Complaint:  Consult for retinal artery occlusion  Primary Care Physician: Max Ryan, No Pcp Per (Inactive)  HPI:  Max Ryan is a 75 y.o. male who is being seen today for the evaluation of retinal artery occlusion at the request of Shirleen Schirmer, [place].  This is a pleasant 75 year old male kindly referred for evaluation and management of retinal artery occlusion.  This was noted by Dr. Carolynn Sayers his ophthalmologist on recent eye exam.  He was instructed to start aspirin and continue his other therapies for hypertension, dyslipidemia and diabetes.  Cardiology referral was placed.  Max Ryan primarily is lived in [place] gets his primary care there.  In the past he said he seen a cardiologist possibly for preoperative risk assessment.  He had a stress test remotely.  He has been on low-level statin and had an LDL of 71 back in 2015.  He has not had recent lipid testing.  He denies any prior coronary disease.  He did have a CT scan of the abdomen and pelvis in September 2020, which demonstrated marked diffuse hepatic steatosis and "atheromatous coronary artery calcifications".  He reports heart disease in his father who also had a pacemaker.  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  He denies any palpitations or history of atrial fibrillation.  He has not had prior carotid Dopplers however has had carotid auscultation.  PMHx:  Past Medical History:  Diagnosis Date   Cancer ([place])    skin cancer   Diabetes mellitus without complication ([place])    History of kidney stones    Hypertension    Sarcoidosis     Past Surgical History:  Procedure Laterality Date   ANTERIOR CERVICAL DECOMPRESSION/DISCECTOMY FUSION 4 LEVELS N/A 08/17/2019   Procedure: ANTERIOR CERVICAL DECOMPRESSION FUSION CERVICAL THREE-FOUR, CERVICAL FOUR-FIVE, CERVICAL FIVE-SIX, CERVICAL SIX-SEVEN WITH INSTRUMENTATION AND ALLOGRAFT;  Surgeon: Phylliss Bob, MD;  Location: [facility];  Service: Orthopedics;  Laterality: N/A;   CARPAL TUNNEL RELEASE Right    CHOLECYSTECTOMY     COLONOSCOPY     CYSTOSCOPY/URETEROSCOPY/HOLMIUM LASER/STENT PLACEMENT Right 07/19/2019   Procedure: CYSTOSCOPY/URETEROSCOPY/HOLMIUM LASER/STENT PLACEMENT;  Surgeon: Ardis Hughs, MD;  Location: [facility];  Service: Urology;  Laterality: Right;   HERNIA REPAIR     JOINT REPLACEMENT Left    knee   KNEE SURGERY Left    LAPAROSCOPIC ROUX-EN-Y GASTRIC BYPASS WITH UPPER ENDOSCOPY AND REMOVAL OF LAP BAND     POSTERIOR CERVICAL FUSION/FORAMINOTOMY N/A 04/26/2020   Procedure: POSTERIOR CERVICAL DECOMPRESSION CERVICAL SIX- THORACIC ONE;  Surgeon: Phylliss Bob, MD;  Location: [facility];  Service: Orthopedics;  Laterality: N/A;   SPINAL FUSION     TONSILLECTOMY      FAMHx:  Family History  Problem Relation Age of Onset   CAD Father     SOCHx:   reports that he quit smoking about 17 years ago. His smoking use included cigarettes. He has never used smokeless tobacco. He reports previous alcohol use of about 42.0 standard drinks of alcohol per week. He reports current drug use. Drug: Marijuana.  ALLERGIES:  No Known Allergies  ROS: Pertinent items noted in HPI and remainder of comprehensive ROS otherwise negative.  HOME MEDS: Current Outpatient Medications on File Prior to Visit  Medication Sig Dispense Refill   carbidopa-levodopa (SINEMET IR) 25-100 MG tablet Take 1 tablet by mouth at bedtime.     gabapentin (NEURONTIN) 600 MG tablet Take 600 mg by mouth 2 (two) times  daily.     losartan (COZAAR) 25 MG tablet Take 25 mg by mouth daily.     lovastatin (MEVACOR) 10 MG tablet Take 10 mg by mouth daily.      metFORMIN (GLUCOPHAGE) 500 MG tablet Take 500 mg by mouth 2 (two) times daily.     repaglinide (PRANDIN) 0.5 MG tablet Take 0.5 mg by mouth 2 (two) times daily.     tamsulosin (FLOMAX) 0.4 MG CAPS capsule Take 0.4 mg by mouth in the morning and at bedtime.     No current facility-administered  medications on file prior to visit.    LABS/IMAGING: No results found for this or any previous visit (from the past 48 hour(s)). No results found.  LIPID PANEL: No results found for: CHOL, TRIG, HDL, CHOLHDL, VLDL, LDLCALC, LDLDIRECT  WEIGHTS: Wt Readings from Last 3 Encounters:  04/30/21 259 lb 10.9 oz (117.8 kg)  04/26/20 255 lb (115.7 kg)  04/19/20 260 lb 3 oz (118 kg)    VITALS: BP 138/76 (BP Location: Left Arm, Max Ryan Position: Sitting, Cuff Size: Normal)   Pulse 62   Ht 5\' 8"  (1.727 m)   Wt 259 lb 10.9 oz (117.8 kg)   BMI 39.48 kg/m   EXAM: General appearance: alert and no distress Neck: no carotid bruit, no JVD, and thyroid not enlarged, symmetric, no tenderness/mass/nodules Lungs: clear to auscultation bilaterally Heart: regular rate and rhythm, S1, S2 normal, no murmur, click, rub or gallop Abdomen: soft, non-tender; bowel sounds normal; no masses,  no organomegaly Extremities: extremities normal, atraumatic, no cyanosis or edema Pulses: 2+ and symmetric Skin: Skin color, texture, turgor normal. 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   [name] was found to have a branch retinal artery occlusion by his ophthalmologist.  He has numerous cardiovascular risk factors and CT evidence of coronary calcification/atherosclerosis.  He is on a low-dose of a low potency statin however had LDL of 71 based on labs many years ago.  I like to repeat his lipid profile.  In addition will obtain carotid Dopplers.  He was started on low-dose aspirin 81 mg daily which I think will be of great benefit in addition to his other medications.  We will try to target his LDL below 70.  He denies any palpitations and there is  no evidence of afib today on his EKG. Plan follow-up with me in about 3 months.  Thanks again for the kind referral.  Pixie Casino, MD, FACC, Portal Director of the [facility] Diplomate of the American Board of Clinical Lipidology Attending Cardiologist  Direct Dial: [phone]  Fax: [phone]  Website:  [email or web address] 04/30/2021, 4:59 PM

## 2021-04-30 NOTE — Patient Instructions (Signed)
Medication Instructions:  No Changes In Medications at this time.  *If you need a refill on your cardiac medications before your next appointment, please call your pharmacy*  Lab Work: [facility]- NO APPOINTMENT IS NEEDED. THE LAB IS OPEN Monday-Friday from 8am to 4pm.  If you have labs (blood work) drawn today and your tests are completely normal, you will receive your results only by: [place] (if you have MyChart) OR A paper copy in the mail If you have any lab test that is abnormal or we need to change your treatment, we will call you to review the results.  Testing/Procedures: Your physician has requested that you have a carotid duplex. This test is an ultrasound of the carotid arteries in your neck. It looks at blood flow through these arteries that supply the brain with blood. Allow one hour for this exam. There are no restrictions or special instructions.  Follow-Up: At [facility], you and your health needs are our priority.  As part of our continuing mission to provide you with exceptional heart care, we have created designated Provider Care Teams.  These Care Teams include your primary Cardiologist (physician) and Advanced Practice Providers (APPs -  Physician Assistants and Nurse Practitioners) who all work together to provide you with the care you need, when you need it.  Your next appointment:   3 month(s)  The format for your next appointment:   In Person  Provider:   K. Mali Hilty, MD

## 2021-05-17 ENCOUNTER — Encounter (HOSPITAL_COMMUNITY): Payer: Self-pay

## 2021-05-17 ENCOUNTER — Other Ambulatory Visit (HOSPITAL_COMMUNITY): Payer: Self-pay | Admitting: Internal Medicine

## 2021-05-17 ENCOUNTER — Ambulatory Visit (HOSPITAL_COMMUNITY)
Admission: RE | Admit: 2021-05-17 | Payer: Medicare Other | Source: Ambulatory Visit | Attending: Internal Medicine | Admitting: Internal Medicine

## 2021-05-17 ENCOUNTER — Ambulatory Visit (HOSPITAL_COMMUNITY)
Admission: RE | Admit: 2021-05-17 | Discharge: 2021-05-17 | Disposition: A | Discharge status: Home / Self Care | Payer: Medicare Other | Source: Ambulatory Visit | Attending: Cardiology | Admitting: Cardiology

## 2021-05-17 ENCOUNTER — Other Ambulatory Visit: Payer: Self-pay

## 2021-05-17 DIAGNOSIS — I6523 Occlusion and stenosis of bilateral carotid arteries: Secondary | ICD-10-CM

## 2021-07-08 ENCOUNTER — Ambulatory Visit: Payer: Medicare Other | Admitting: Family Medicine

## 2021-07-19 ENCOUNTER — Ambulatory Visit: Payer: Medicare Other | Admitting: Family Medicine

## 2021-07-23 ENCOUNTER — Ambulatory Visit: Payer: Medicare HMO | Admitting: Internal Medicine

## 2021-07-23 ENCOUNTER — Other Ambulatory Visit: Payer: Self-pay

## 2021-07-23 ENCOUNTER — Encounter: Payer: Self-pay | Admitting: Internal Medicine

## 2021-07-23 VITALS — BP 159/85 | HR 63 | Ht 68.0 in | Wt 256.8 lb

## 2021-07-23 DIAGNOSIS — E782 Mixed hyperlipidemia: Secondary | ICD-10-CM | POA: Diagnosis not present

## 2021-07-23 DIAGNOSIS — I2584 Coronary atherosclerosis due to calcified coronary lesion: Secondary | ICD-10-CM

## 2021-07-23 DIAGNOSIS — I10 Essential (primary) hypertension: Secondary | ICD-10-CM

## 2021-07-23 DIAGNOSIS — R0609 Other forms of dyspnea: Secondary | ICD-10-CM

## 2021-07-23 DIAGNOSIS — E119 Type 2 diabetes mellitus without complications: Secondary | ICD-10-CM

## 2021-07-23 DIAGNOSIS — I2510 Atherosclerotic heart disease of native coronary artery without angina pectoris: Secondary | ICD-10-CM

## 2021-07-23 DIAGNOSIS — R0600 Dyspnea, unspecified: Secondary | ICD-10-CM

## 2021-07-23 MED ORDER — LOSARTAN POTASSIUM 50 MG PO TABS: 50.0000 mg | ORAL_TABLET | Freq: Every day | ORAL | 3 refills | Status: DC

## 2021-07-23 MED ORDER — METOPROLOL TARTRATE 50 MG PO TABS: 50.0000 mg | ORAL_TABLET | Freq: Once | ORAL | 0 refills | Status: DC

## 2021-07-23 NOTE — Patient Instructions (Signed)
Medication Instructions:  INCREASE losartan to '50mg'$  daily  *If you need a refill on your cardiac medications before your next appointment, please call your pharmacy*   Lab Work: BMET, Lipid, A1c - fasting -- complete about 1 week prior to CT test  If you have labs (blood work) drawn today and your tests are completely normal, you will receive your results only by: [place] (if you have MyChart) OR A paper copy in the mail If you have any lab test that is abnormal or we need to change your treatment, we will call you to review the results.   Testing/Procedures: Cardiac CT at [place]: At [facility], you and your health needs are our priority.  As part of our continuing mission to provide you with exceptional heart care, we have created designated Provider Care Teams.  These Care Teams include your primary Cardiologist (physician) and Advanced Practice Providers (APPs -  Physician Assistants and Nurse Practitioners) who all work together to provide you with the care you need, when you need it.  We recommend signing up for the patient portal called "MyChart".  Sign up information is provided on this After Visit Summary.  MyChart is used to connect with patients for Virtual Visits (Telemedicine).  Patients are able to view lab/test results, encounter notes, upcoming appointments, etc.  Non-urgent messages can be sent to your provider as well.   To learn more about what you can do with MyChart, go to [email or web address].    Your next appointment:   2 month(s)  The format for your next appointment:   In Person  Provider:   You may see Dr. Debara Pickett or one of the following Advanced Practice Providers on your designated Care Team:   Almyra Deforest, PA-C Fabian Sharp, [place] or  Roby Lofts, [place]   Other Instructions    Your cardiac CT will be scheduled at one of the below locations:   [facility] [address] [place], [place] [address] [phone]  [facility] [address] [place], [place] [address] [phone]  If scheduled at [facility], please arrive at the [facility] main entrance (entrance A) of [facility] 30 minutes prior to test start time. Proceed to the [facility] Radiology Department (first floor) to check-in and test prep.  If scheduled at [facility], please arrive 15 mins early for check-in and test prep.  Please follow these instructions carefully (unless otherwise directed):  Hold all erectile dysfunction medications at least 3 days (72 hrs) prior to test.  On the Night Before the Test: Be sure to Drink plenty of water. Do not consume any caffeinated/decaffeinated beverages or chocolate 12 hours prior to your test. Do not take any antihistamines 12 hours prior to your test.  On the Day of the Test: Drink plenty of water until 1 hour prior to the test. Do not eat any food 4 hours prior to the test. You may take your regular medications prior to the test.  Take metoprolol (Lopressor) two hours prior to test. HOLD Furosemide/Hydrochlorothiazide morning of the test.  After the Test: Drink plenty of water. After receiving IV contrast, you may experience a mild flushed feeling. This is normal. On occasion, you may experience a mild rash up to 24 hours after the test. This is not dangerous. If this occurs, you can take Benadryl 25 mg and increase your fluid intake. If you experience trouble  breathing, this can be serious. If it is severe call 911 IMMEDIATELY. If it is mild, please call our office. If you take any of these medications: Glipizide/Metformin, Avandament, Glucavance, please do not take 48 hours after completing test unless otherwise instructed.  Please allow 2-4 weeks for scheduling of routine cardiac CTs. Some insurance companies require a pre-authorization which may delay scheduling of this test.    For non-scheduling related questions, please contact the cardiac imaging nurse navigator should you have any questions/concerns: Marchia Bond, Cardiac Imaging Nurse Navigator Gordy Clement, Cardiac Imaging Nurse Navigator [facility] Direct Office Dial: [phone]   For scheduling needs, including cancellations and rescheduling, please call [place], [phone].

## 2021-07-23 NOTE — Progress Notes (Signed)
OFFICE CONSULT NOTE  Chief Complaint:  Follow-up  Primary Care Physician: Patient, No Pcp Per (Inactive)  HPI:  Max Ryan is a 75 y.o. male who is being seen today for the evaluation of retinal artery occlusion at the request of Shirleen Schirmer, [place].  This is a pleasant 75 year old male kindly referred for evaluation and management of retinal artery occlusion.  This was noted by Dr. Carolynn Sayers his ophthalmologist on recent eye exam.  He was instructed to start aspirin and continue his other therapies for hypertension, dyslipidemia and diabetes.  Cardiology referral was placed.  Max Ryan primarily is lived in [place] gets his primary care there.  In the past he said he seen a cardiologist possibly for preoperative risk assessment.  He had a stress test remotely.  He has been on low-level statin and had an LDL of 71 back in 2015.  He has not had recent lipid testing.  He denies any prior coronary disease.  He did have a CT scan of the abdomen and pelvis in September 2020, which demonstrated marked diffuse hepatic steatosis and "atheromatous coronary artery calcifications".  He reports heart disease in his father who also had a pacemaker.  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  He denies any palpitations or history of atrial fibrillation.  He has not had prior carotid Dopplers however has had carotid auscultation.  07/23/2021  Max Ryan returns today for follow-up.  He was seen for retinal artery occlusion work-up.  He did undergo vascular Dopplers of the carotids in July which showed minimal bilateral carotid thickening.  He was advised by me to start low-dose aspirin.  He was supposed to get a lipid profile however that was not drawn.  He said he recently had lipids through a primary care provider called [facility] a few weeks ago.  We will see if we can get those records.  He has been complaining of shortness of breath which has been progressive over the past  several weeks.  He says he has a remote history of pulmonary sarcoidosis but has not been on treatment for that.  Again CT in the past showed atheromatous coronary artery calcifications and diffuse hepatic steatosis.  He denies any anginal pain.  PMHx:  Past Medical History:  Diagnosis Date   Cancer ([place])    skin cancer   Diabetes mellitus without complication ([place])    History of kidney stones    Hypertension    Sarcoidosis     Past Surgical History:  Procedure Laterality Date   ANTERIOR CERVICAL DECOMPRESSION/DISCECTOMY FUSION 4 LEVELS N/A 08/17/2019   Procedure: ANTERIOR CERVICAL DECOMPRESSION FUSION CERVICAL THREE-FOUR, CERVICAL FOUR-FIVE, CERVICAL FIVE-SIX, CERVICAL SIX-SEVEN WITH INSTRUMENTATION AND ALLOGRAFT;  Surgeon: Phylliss Bob, MD;  Location: [facility];  Service: Orthopedics;  Laterality: N/A;   CARPAL TUNNEL RELEASE Right    CHOLECYSTECTOMY     COLONOSCOPY     CYSTOSCOPY/URETEROSCOPY/HOLMIUM LASER/STENT PLACEMENT Right 07/19/2019   Procedure: CYSTOSCOPY/URETEROSCOPY/HOLMIUM LASER/STENT PLACEMENT;  Surgeon: Ardis Hughs, MD;  Location: [facility];  Service: Urology;  Laterality: Right;   HERNIA REPAIR     JOINT REPLACEMENT Left    knee   KNEE SURGERY Left    LAPAROSCOPIC ROUX-EN-Y GASTRIC BYPASS WITH UPPER ENDOSCOPY AND REMOVAL OF LAP BAND     POSTERIOR CERVICAL FUSION/FORAMINOTOMY N/A 04/26/2020   Procedure: POSTERIOR CERVICAL DECOMPRESSION CERVICAL SIX- THORACIC ONE;  Surgeon: Phylliss Bob, MD;  Location: [facility];  Service: Orthopedics;  Laterality: N/A;   SPINAL  FUSION     TONSILLECTOMY      FAMHx:  Family History  Problem Relation Age of Onset   CAD Father     SOCHx:   reports that he quit smoking about 17 years ago. His smoking use included cigarettes. He has never used smokeless tobacco. He reports that he does not currently use alcohol after a past usage of about 42.0 standard drinks per week. He reports current drug use. Drug: Marijuana.  ALLERGIES:  No  Known Allergies  ROS: Pertinent items noted in HPI and remainder of comprehensive ROS otherwise negative.  HOME MEDS: Current Outpatient Medications on File Prior to Visit  Medication Sig Dispense Refill   carbidopa-levodopa (SINEMET IR) 25-100 MG tablet Take 1 tablet by mouth at bedtime.     gabapentin (NEURONTIN) 600 MG tablet Take 600 mg by mouth 2 (two) times daily.     losartan (COZAAR) 25 MG tablet Take 25 mg by mouth daily.     lovastatin (MEVACOR) 10 MG tablet Take 10 mg by mouth daily.      metFORMIN (GLUCOPHAGE) 500 MG tablet Take 500 mg by mouth 2 (two) times daily.     repaglinide (PRANDIN) 0.5 MG tablet Take 0.5 mg by mouth 2 (two) times daily.     tamsulosin (FLOMAX) 0.4 MG CAPS capsule Take 0.4 mg by mouth in the morning and at bedtime. (Patient not taking: Reported on 07/23/2021)     No current facility-administered medications on file prior to visit.    LABS/IMAGING: No results found for this or any previous visit (from the past 48 hour(s)). No results found.  LIPID PANEL: No results found for: CHOL, TRIG, HDL, CHOLHDL, VLDL, LDLCALC, LDLDIRECT  WEIGHTS: Wt Readings from Last 3 Encounters:  07/23/21 256 lb 12.8 oz (116.5 kg)  04/30/21 259 lb 10.9 oz (117.8 kg)  04/26/20 255 lb (115.7 kg)    VITALS: BP (!) 159/85   Pulse 63   Ht '5\' 8"'$  (1.727 m)   Wt 256 lb 12.8 oz (116.5 kg)   SpO2 98%   BMI 39.05 kg/m   EXAM: General appearance: alert and no distress Neck: no carotid bruit, no JVD, and thyroid not enlarged, symmetric, no tenderness/mass/nodules Lungs: clear to auscultation bilaterally Heart: regular rate and rhythm, S1, S2 normal, no murmur, click, rub or gallop Abdomen: soft, non-tender; bowel sounds normal; no masses,  no organomegaly Extremities: extremities normal, atraumatic, no cyanosis or edema Pulses: 2+ and symmetric Skin: Skin color, texture, turgor normal. No rashes or lesions Neurologic: Grossly normal Psych: Pleasant  EKG: Normal  sinus rhythm at 62, LAFB- personally reviewed  ASSESSMENT: Progressive dyspnea on exertion Branch retinal artery occlusion CT findings of coronary calcification/atherosclerosis Severe hepatic steatosis Type 2 diabetes Hypertension Morbid obesity Pulmonary sarcoidosis  PLAN: 1.   Max Ryan had symptoms of progressive dyspnea on exertion over the past several weeks.  He reports a history of pulmonary sarcoidosis in the past but is untreated for this.  He also has known coronary artery calcifications and this could represent an anginal equivalent.  Would recommend CT coronary angiography to evaluate his coronaries as well as the pulmonary tree to determine if there is any active sarcoid.  I would more expect this to cause some chest pain rather than shortness of breath.  He has had no further retinal artery issues that he is aware of or changes in his vision.  He is on the low potency statin.  I like to see what his lipids are at  most likely would recommend higher doses.  Also his blood pressure is not at target today.  We will increase his losartan from 25 to 50 mg daily.  Plan follow-up with me in a few months.  Further medication changes based on his CT results.  Pixie Casino, MD, [facility], [place] Director of the Advanced Lipid Disorders &  Cardiovascular Risk Reduction Clinic Diplomate of the American Board of Clinical Lipidology Attending Cardiologist  Direct Dial: [phone]  Fax: [phone]  Website:  [email or web address] 07/23/2021, 10:34 AM

## 2021-07-25 LAB — LIPID PANEL
Chol/HDL Ratio: 3.7 ratio (ref 0.0–5.0)
Cholesterol, Total: 147 mg/dL (ref 100–199)
HDL: 40 mg/dL (ref >39)
LDL Chol Calc (NIH): 81 mg/dL (ref 0–99)
Triglycerides: 148 mg/dL (ref 0–149)
VLDL Cholesterol Cal: 26 mg/dL (ref 5–40)

## 2021-07-25 LAB — BASIC METABOLIC PANEL
BUN/Creatinine Ratio: 22 (ref 10–24)
BUN: 24 mg/dL (ref 8–27)
CO2: 27 mmol/L (ref 20–29)
Calcium: 10.5 mg/dL — ABNORMAL HIGH (ref 8.6–10.2)
Chloride: 98 mmol/L (ref 96–106)
Creatinine, Ser: 1.08 mg/dL (ref 0.76–1.27)
Glucose: 127 mg/dL — ABNORMAL HIGH (ref 65–99)
Potassium: 5.6 mmol/L — ABNORMAL HIGH (ref 3.5–5.2)
Sodium: 142 mmol/L (ref 134–144)
eGFR: 72 mL/min/{1.73_m2} (ref >59)

## 2021-07-25 LAB — HEMOGLOBIN A1C
Est. average glucose Bld gHb Est-mCnc: 134 mg/dL
Hgb A1c MFr Bld: 6.3 % — ABNORMAL HIGH (ref 4.8–5.6)

## 2021-07-29 ENCOUNTER — Other Ambulatory Visit: Payer: Self-pay

## 2021-07-29 DIAGNOSIS — Z79899 Other long term (current) drug therapy: Secondary | ICD-10-CM

## 2021-07-30 ENCOUNTER — Telehealth (HOSPITAL_COMMUNITY): Payer: Self-pay | Admitting: Emergency Medicine

## 2021-07-30 NOTE — Telephone Encounter (Signed)
Reaching out to patient to offer assistance regarding upcoming cardiac imaging study; pt verbalizes understanding of appt date/time, parking situation and where to check in, pre-test NPO status and medications ordered, and verified current allergies; name and call back number provided for further questions should they arise Marchia Bond RN Navigator Cardiac Imaging [facility] [phone] office [phone] cell  Denies claustro 50mg  metoprolol  Denies iv issues

## 2021-07-31 ENCOUNTER — Encounter: Payer: Medicare HMO | Admitting: *Deleted

## 2021-07-31 ENCOUNTER — Other Ambulatory Visit: Payer: Self-pay

## 2021-07-31 ENCOUNTER — Ambulatory Visit (HOSPITAL_COMMUNITY)
Admission: RE | Admit: 2021-07-31 | Discharge: 2021-07-31 | Disposition: A | Discharge status: Home / Self Care | Payer: Medicare HMO | Source: Ambulatory Visit | Attending: Internal Medicine | Admitting: Internal Medicine

## 2021-07-31 DIAGNOSIS — R0609 Other forms of dyspnea: Secondary | ICD-10-CM

## 2021-07-31 DIAGNOSIS — R0600 Dyspnea, unspecified: Secondary | ICD-10-CM | POA: Insufficient documentation

## 2021-07-31 DIAGNOSIS — I2584 Coronary atherosclerosis due to calcified coronary lesion: Secondary | ICD-10-CM | POA: Insufficient documentation

## 2021-07-31 DIAGNOSIS — I2510 Atherosclerotic heart disease of native coronary artery without angina pectoris: Secondary | ICD-10-CM | POA: Diagnosis present

## 2021-07-31 DIAGNOSIS — Z006 Encounter for examination for normal comparison and control in clinical research program: Secondary | ICD-10-CM

## 2021-07-31 MED ORDER — NITROGLYCERIN 0.4 MG SL SUBL
SUBLINGUAL_TABLET | SUBLINGUAL | Status: AC
  Filled 2021-07-31: qty 2

## 2021-07-31 MED ORDER — IOHEXOL 350 MG/ML SOLN
100.0000 mL | Freq: Once | INTRAVENOUS | Status: AC | PRN
  Administered 2021-07-31: 100 mL via INTRAVENOUS

## 2021-07-31 MED ORDER — NITROGLYCERIN 0.4 MG SL SUBL
0.8000 mg | SUBLINGUAL_TABLET | Freq: Once | SUBLINGUAL | Status: AC
  Administered 2021-07-31: 0.8 mg via SUBLINGUAL

## 2021-07-31 NOTE — Research (Signed)
IDENTIFY Informed Consent        Subject Name:   Max Ryan Enrollment time:  07-31-21 at 1020 am  Subject met inclusion and exclusion criteria.  The informed consent form, study requirements and expectations were reviewed with the subject and questions and concerns were addressed prior to the signing of the consent form.  The subject verbalized understanding of the trial requirements.  The subject agreed to participate in the IDENTIFY trial and signed the informed consent.  The informed consent was obtained prior to performance of any protocol-specific procedures for the subject.  A copy of the signed informed consent was given to the subject and a copy was placed in the subject's medical record.   Leota Jacobsen, BSN, RN [place] Nurse [facility] for Research and Education [phone]

## 2021-08-02 ENCOUNTER — Other Ambulatory Visit: Payer: Self-pay

## 2021-08-02 DIAGNOSIS — Z79899 Other long term (current) drug therapy: Secondary | ICD-10-CM

## 2021-08-02 LAB — BASIC METABOLIC PANEL
BUN/Creatinine Ratio: 19 (ref 10–24)
BUN: 16 mg/dL (ref 8–27)
CO2: 25 mmol/L (ref 20–29)
Calcium: 9.6 mg/dL (ref 8.6–10.2)
Chloride: 103 mmol/L (ref 96–106)
Creatinine, Ser: 0.83 mg/dL (ref 0.76–1.27)
Glucose: 125 mg/dL — ABNORMAL HIGH (ref 65–99)
Potassium: 5.3 mmol/L — ABNORMAL HIGH (ref 3.5–5.2)
Sodium: 142 mmol/L (ref 134–144)
eGFR: 92 mL/min/{1.73_m2} (ref >59)

## 2021-08-12 ENCOUNTER — Encounter (HOSPITAL_BASED_OUTPATIENT_CLINIC_OR_DEPARTMENT_OTHER): Payer: Self-pay

## 2021-08-12 ENCOUNTER — Other Ambulatory Visit (HOSPITAL_COMMUNITY): Payer: Self-pay | Admitting: Urgent Care

## 2021-08-12 DIAGNOSIS — I517 Cardiomegaly: Secondary | ICD-10-CM

## 2021-08-23 ENCOUNTER — Other Ambulatory Visit (HOSPITAL_COMMUNITY): Payer: Medicare HMO

## 2021-08-27 ENCOUNTER — Other Ambulatory Visit: Payer: Self-pay

## 2021-08-27 ENCOUNTER — Ambulatory Visit (HOSPITAL_COMMUNITY): Payer: Medicare HMO | Attending: Cardiovascular Disease

## 2021-08-27 DIAGNOSIS — I517 Cardiomegaly: Secondary | ICD-10-CM | POA: Diagnosis present

## 2021-08-27 LAB — ECHOCARDIOGRAM COMPLETE
Area-P 1/2: 2.79 cm2
S' Lateral: 3.8 cm

## 2021-09-20 ENCOUNTER — Ambulatory Visit: Payer: Medicare HMO | Admitting: Physician Assistant

## 2021-09-20 ENCOUNTER — Encounter: Payer: Self-pay | Admitting: Medical

## 2021-09-20 ENCOUNTER — Ambulatory Visit: Payer: Medicare HMO | Admitting: Medical

## 2021-09-20 ENCOUNTER — Other Ambulatory Visit: Payer: Self-pay

## 2021-09-20 VITALS — BP 118/62 | HR 60 | Ht 68.0 in | Wt 236.2 lb

## 2021-09-20 DIAGNOSIS — E119 Type 2 diabetes mellitus without complications: Secondary | ICD-10-CM | POA: Diagnosis not present

## 2021-09-20 DIAGNOSIS — I10 Essential (primary) hypertension: Secondary | ICD-10-CM

## 2021-09-20 DIAGNOSIS — E785 Hyperlipidemia, unspecified: Secondary | ICD-10-CM

## 2021-09-20 DIAGNOSIS — I2510 Atherosclerotic heart disease of native coronary artery without angina pectoris: Secondary | ICD-10-CM

## 2021-09-20 DIAGNOSIS — H349 Unspecified retinal vascular occlusion: Secondary | ICD-10-CM

## 2021-09-20 MED ORDER — ATORVASTATIN CALCIUM 40 MG PO TABS: 40.0000 mg | ORAL_TABLET | Freq: Every day | ORAL | 3 refills | Status: DC

## 2021-09-20 NOTE — Patient Instructions (Signed)
Medication Instructions:  STOP Lovastatin  START Atorvastatin 40 mg daily  *If you need a refill on your cardiac medications before your next appointment, please call your pharmacy*  Lab Work: Your physician recommends that you return for lab work in 2 months:  Fasting Lipid Panel-DO NOT EAT OR DRINK PAST MIDNIGHT. OKAY TO HAVE WATER Hepatic (Liver) Function Test   If you have labs (blood work) drawn today and your tests are completely normal, you will receive your results only by: MyChart Message (if you have MyChart) OR A paper copy in the mail If you have any lab test that is abnormal or we need to change your treatment, we will call you to review the results.  Testing/Procedures: NONE ordered at this time of appointment   Follow-Up: At [facility], you and your health needs are our priority.  As part of our continuing mission to provide you with exceptional heart care, we have created designated Provider Care Teams.  These Care Teams include your primary Cardiologist (physician) and Advanced Practice Providers (APPs -  Physician Assistants and Nurse Practitioners) who all work together to provide you with the care you need, when you need it.  Your next appointment:   6 month(s)  The format for your next appointment:   In Person  Provider:   Pixie Casino, MD    Other Instructions

## 2021-09-20 NOTE — Progress Notes (Signed)
Cardiology Office Note   Date:  09/20/2021   ID:  SHAWNTA ZIMBELMAN, DOB Jun 04, 1946, MRN [id number]  PCP:  Patient, No Pcp Per (Inactive)  Cardiologist:  Pixie Casino, MD EP: None  Chief Complaint  Patient presents with   Follow-up    Recent cardiac work-up       History of Present Illness: AHMAN DUGDALE is a 75 y.o. male with PMH of HTN, HLD, DM type II, sarcoidosis, and retinal artery occlusion who presents for 46-month follow-up.  He was last evaluated by cardiology at an outpatient visit with Dr. Debara Pickett 07/23/2021.  He establish care with cardiology 04/2021 after his ophthalmologist diagnosed a retinal artery occlusion.  He underwent carotid Doppler 05/2021 which showed minimal carotid artery disease.  He was started on aspirin 81 mg.  At his follow-up visit in September he noted progressive shortness of breath for the past several weeks.  He had no prior ischemic testing but was noted to have coronary artery calcifications on prior CT scan.  He underwent a coronary CTA 07/2021 which showed predominantly diffuse mild three-vessel coronary artery disease with calcium score 1531 placing him in the 80th percentile for age/sex.  He underwent an echocardiogram 08/27/2021 which showed EF 60-65%, no R WMA, moderate concentric LVH, indeterminate LV diastolic function, no significant valvular dysfunction, and mild dilation of the ascending aorta at 42 mm.  He presents today for follow-up of his recent cardiac work-up.  We reviewed his coronary CTA and echocardiogram in detail.  Overall he has been doing fairly well from a cardiac standpoint.  He notes resolution of his shortness of breath which he mentioned at his visit 07/2021.  He has been changing his diet/exercise habits in an effort to lose some weight, down 20 pounds since his last visit, and as a result has noticed some lower blood pressure readings.  He reports SBP was in the 90s and he had been feeling poorly.  As a result he decrease his  losartan to 25 mg daily with improvement in blood pressures.  He denies any palpitations, dizziness, lightheadedness, syncope, orthopnea, PND, lower extremity edema.  We discussed that a cardiac monitor would complete his work-up for retinal artery occlusion.  He reports that he is anticipating some intervention for back pain in the near future and would like to hold off at this time.  We reviewed stroke symptoms and ED precautions.  Past Medical History:  Diagnosis Date   Cancer ([place])    skin cancer   Diabetes mellitus without complication ([place])    History of kidney stones    Hypertension    Sarcoidosis     Past Surgical History:  Procedure Laterality Date   ANTERIOR CERVICAL DECOMPRESSION/DISCECTOMY FUSION 4 LEVELS N/A 08/17/2019   Procedure: ANTERIOR CERVICAL DECOMPRESSION FUSION CERVICAL THREE-FOUR, CERVICAL FOUR-FIVE, CERVICAL FIVE-SIX, CERVICAL SIX-SEVEN WITH INSTRUMENTATION AND ALLOGRAFT;  Surgeon: Phylliss Bob, MD;  Location: [place];  Service: Orthopedics;  Laterality: N/A;   CARPAL TUNNEL RELEASE Right    CHOLECYSTECTOMY     COLONOSCOPY     CYSTOSCOPY/URETEROSCOPY/HOLMIUM LASER/STENT PLACEMENT Right 07/19/2019   Procedure: CYSTOSCOPY/URETEROSCOPY/HOLMIUM LASER/STENT PLACEMENT;  Surgeon: Ardis Hughs, MD;  Location[facility];  Service: Urology;  Laterality: Right;   HERNIA REPAIR     JOINT REPLACEMENT Left    knee   KNEE SURGERY Left    LAPAROSCOPIC ROUX-EN-Y GASTRIC BYPASS WITH UPPER ENDOSCOPY AND REMOVAL OF LAP BAND     POSTERIOR CERVICAL FUSION/FORAMINOTOMY N/A 04/26/2020   Procedure:  POSTERIOR CERVICAL DECOMPRESSION CERVICAL SIX- THORACIC ONE;  Surgeon: Phylliss Bob, MD;  Location: [place];  Service: Orthopedics;  Laterality: N/A;   SPINAL FUSION     TONSILLECTOMY       Current Outpatient Medications  Medication Sig Dispense Refill   aspirin EC 81 MG tablet Take 81 mg by mouth daily. Swallow whole.     atorvastatin (LIPITOR) 40 MG tablet Take 1 tablet (40 mg total)  by mouth daily. 90 tablet 3   carbidopa-levodopa (SINEMET IR) 25-100 MG tablet Take 1 tablet by mouth at bedtime.     gabapentin (NEURONTIN) 600 MG tablet Take 600 mg by mouth 2 (two) times daily.     losartan (COZAAR) 25 MG tablet Take 25 mg by mouth daily.     metFORMIN (GLUCOPHAGE) 500 MG tablet Take 500 mg by mouth 2 (two) times daily.     repaglinide (PRANDIN) 0.5 MG tablet Take 0.5 mg by mouth 2 (two) times daily.     traZODone (DESYREL) 100 MG tablet Take 100 mg by mouth at bedtime.     No current facility-administered medications for this visit.    Allergies:   Patient has no known allergies.    Social History:  The patient  reports that he quit smoking about 17 years ago. His smoking use included cigarettes. He has never used smokeless tobacco. He reports that he does not currently use alcohol after a past usage of about 42.0 standard drinks per week. He reports current drug use. Drug: Marijuana.   Family History:  The patient's family history includes CAD in his father.    ROS:  Please see the history of present illness.   Otherwise, review of systems are positive for none.   All other systems are reviewed and negative.    PHYSICAL EXAM: VS:  BP 118/62 (BP Location: Left Arm, Patient Position: Sitting, Cuff Size: Normal)   Pulse 60   Ht 5\' 8"  (1.727 m)   Wt 236 lb 3.2 oz (107.1 kg)   SpO2 96%   BMI 35.91 kg/m  , BMI Body mass index is 35.91 kg/m. GEN: Well nourished, well developed, in no acute distress HEENT: Sclera anicteric Neck: no JVD, carotid bruits, or masses Cardiac: RRR; no murmurs, rubs, or gallops, no edema  Respiratory:  clear to auscultation bilaterally, normal work of breathing GI: soft, nontender, nondistended, + BS MS: no deformity or atrophy Skin: warm and dry, no rash Neuro:  Strength and sensation are intact Psych: euthymic mood, full affect   EKG:  EKG is not ordered today.   Recent Labs: 08/02/2021: BUN 16; Creatinine, Ser 0.83; Potassium  5.3; Sodium 142    Lipid Panel    Component Value Date/Time   CHOL 147 07/25/2021 1031   TRIG 148 07/25/2021 1031   HDL 40 07/25/2021 1031   CHOLHDL 3.7 07/25/2021 1031   LDLCALC 81 07/25/2021 1031      Wt Readings from Last 3 Encounters:  09/20/21 236 lb 3.2 oz (107.1 kg)  07/23/21 256 lb 12.8 oz (116.5 kg)  04/30/21 259 lb 10.9 oz (117.8 kg)      Other studies Reviewed: Additional studies/ records that were reviewed today include:   Coronary CTA 07/2021: FINDINGS: Non-cardiac: See separate report from [facility]. No significant findings on limited lung and soft tissue windows.   Calcium score: LM and 3 vessel coronary calcium noted   Coronary Arteries: Right dominant with no anomalies   LM: 1-24% calcified ostial plaque   LAD: 25-49%  long area of calcified plaque proximally 1-24% calcified plaque in mid vessel   IM: 1-24% calcified plaque in proximal vessel   D1: 1-24% calcified plaque proximally and 25-49% calcified plaque in mid vessel   D2: Normal   Circumflex: 1-24% calcified plaque in proximal and mid vessel   OM1: Normal   AV Groove: Normal   RCA: 1-24% calcified plaque in proximal/mid and distal RCA   PDA: Normal   PLA: 1-24% calcific plaque   IMPRESSION: 1. LM and 3 vessel calcium with score 1531 which is 38 th percentile for age / sex   2.  Severe mitral annular calcification   3.  Mildly dilated ascending thoracic aorta 3.9 cm   4.  Dilated mPA 3.6 cm with rPA proximally 3.1 cm and lPA 2.5 cm   5. See radiology report regarding numerous densely calcified mediastinal/ hilar lymph nodes? Consistent with sarcoid   6.  CAD RADS 2 non obstructive CAD see description above  Echocardiogram 08/2021: 1. Left ventricular ejection fraction, by estimation, is 60 to 65%. The  left ventricle has normal function. The left ventricle has no regional  wall motion abnormalities. There is moderate concentric left ventricular  hypertrophy.  Left ventricular  diastolic function could not be evaluated.   2. Right ventricular systolic function is normal. The right ventricular  size is normal. There is normal pulmonary artery systolic pressure. The  estimated right ventricular systolic pressure is 57.3 mmHg.   3. Moderate mitral annular calcium is present. There is calcified  redundant chordal tissue below the PMVL. The mitral valve is degenerative.  No evidence of mitral valve regurgitation. No evidence of mitral stenosis.  Moderate mitral annular  calcification.   4. The aortic valve is tricuspid. There is mild calcification of the  aortic valve. Aortic valve regurgitation is not visualized. Mild aortic  valve sclerosis is present, with no evidence of aortic valve stenosis.   5. There is mild dilatation of the ascending aorta, measuring 42 mm.   6. The inferior vena cava is normal in size with greater than 50%  respiratory variability, suggesting right atrial pressure of 3 mmHg.   Carotid duplex 05/2021: Summary:  Right Carotid: The extracranial vessels were near-normal with only minimal  wall                 thickening or plaque.   Left Carotid: The extracranial vessels were near-normal with only minimal  wall                thickening or plaque.   Vertebrals:  Bilateral vertebral arteries demonstrate antegrade flow.  Subclavians: Normal flow hemodynamics were seen in bilateral subclavian               arteries.   ASSESSMENT AND PLAN:   1. Nonobstructive CAD: Generally mild diffuse CAD on coronary CTA 07/2021.  He has not had any anginal complaints since his last visit.  Echocardiogram reassuring. -Continue aspirin and statin -Continue aggressive risk factor modifications for BP <130/80, LDL <70, and A1c <7  2. HTN: BP 118/62 today.  Recently self reduce his losartan to 25 mg daily due to SBP in the 90s.  Blood pressure improved with approximately 20 pound weight loss over the past 2 months. -Continue losartan 25 mg  daily -He will continue to monitor blood pressures and notify office if greater than 130/80 -Encouraged to continue dietary/lifestyle modifications for ongoing weight loss  3. HLD: LDL 81 07/2021 on lovastatin. -We will transition to  atorvastatin 40 mg daily for more aggressive cholesterol management in light of above work-up. -We will repeat FLP/LFTs in 2 months  4. DM type II: A1c 6.3 07/2021; at goal of less than 7 -Continue metformin and Prandin per PCP  5. Retinal artery occlusion: Diagnosed by ophthalmologist earlier this year.  He has not had any palpitations to suggest paroxysmal A. fib/flutter.  Recommended a 30-day monitor to complete his stroke work-up however he wished to hold off at this time.  We reviewed signs/symptoms of a CVA and ED precautions were reviewed -Consider completing a 30-day monitor down the road to evaluate for paroxysmal A. fib   Current medicines are reviewed at length with the patient today.  The patient does not have concerns regarding medicines.  The following changes have been made: As above  Labs/ tests ordered today include:   Orders Placed This Encounter  Procedures   Lipid panel   Hepatic function panel      Disposition:   FU with Dr. Debara Pickett in 6 months  Signed, Abigail Butts, PA-C  09/20/2021 5:00 PM

## 2021-10-16 ENCOUNTER — Other Ambulatory Visit: Payer: Self-pay | Admitting: Orthopedic Surgery

## 2021-10-28 NOTE — Progress Notes (Signed)
Surgical Instructions    Your procedure is scheduled on 11/06/21.  Report to [facility] Main Entrance "A" at 12:30 P.M., then check in with the Admitting office.  Call this number if you have problems the morning of surgery:  [phone]   If you have any questions prior to your surgery date call [phone]: Open Monday-Friday 8am-4pm    Remember:  Do not eat after midnight the night before your surgery  You may drink clear liquids until 11:30am the morning of your surgery.   Clear liquids allowed are: Water, Non-Citrus Juices (without pulp), Carbonated Beverages, Clear Tea, Black Coffee ONLY (NO MILK, CREAM OR POWDERED CREAMER of any kind), and Gatorade  Patient Instructions  The night before surgery:  No food after midnight. ONLY clear liquids after midnight   The day of surgery (if you have diabetes): Drink ONE (1) 12 oz G2 given to you in your pre admission testing appointment by 11:30am the morning of surgery. Drink in one sitting. Do not sip.  This drink was given to you during your hospital  pre-op appointment visit.  Nothing else to drink after completing the  12 oz bottle of G2.         If you have questions, please contact your surgeons office.     Take these medicines the morning of surgery with A SIP OF WATER  atorvastatin (LIPITOR) gabapentin (NEURONTIN)  traMADol (ULTRAM) if needed  As of today, STOP taking any Aspirin (unless otherwise instructed by your surgeon) Aleve, Naproxen, Ibuprofen, Motrin, Advil, Goody's, BC's, all herbal medications, fish oil, and all vitamins.  WHAT DO I DO ABOUT MY DIABETES MEDICATION?   Do not take oral diabetes medicines (pills) the morning of surgery.      THE MORNING OF SURGERY, do not take metFORMIN (GLUCOPHAGE).  The day of surgery, do not take other diabetes injectables, including Byetta (exenatide), Bydureon (exenatide ER), Victoza (liraglutide), or Trulicity (dulaglutide).  If your CBG is greater than 220  mg/dL, you may take  of your sliding scale (correction) dose of insulin.   HOW TO MANAGE YOUR DIABETES BEFORE AND AFTER SURGERY  Why is it important to control my blood sugar before and after surgery? Improving blood sugar levels before and after surgery helps healing and can limit problems. A way of improving blood sugar control is eating a healthy diet by:  Eating less sugar and carbohydrates  Increasing activity/exercise  Talking with your doctor about reaching your blood sugar goals High blood sugars (greater than 180 mg/dL) can raise your risk of infections and slow your recovery, so you will need to focus on controlling your diabetes during the weeks before surgery. Make sure that the doctor who takes care of your diabetes knows about your planned surgery including the date and location.  How do I manage my blood sugar before surgery? Check your blood sugar at least 4 times a day, starting 2 days before surgery, to make sure that the level is not too high or low.  Check your blood sugar the morning of your surgery when you wake up and every 2 hours until you get to the Short Stay unit.  If your blood sugar is less than 70 mg/dL, you will need to treat for low blood sugar: Do not take insulin. Treat a low blood sugar (less than 70 mg/dL) with  cup of clear juice (cranberry or apple), 4 glucose tablets, OR glucose gel. Recheck blood sugar in 15 minutes after treatment (to make sure it  is greater than 70 mg/dL). If your blood sugar is not greater than 70 mg/dL on recheck, call [phone] for further instructions. Report your blood sugar to the short stay nurse when you get to Short Stay.  If you are admitted to the hospital after surgery: Your blood sugar will be checked by the staff and you will probably be given insulin after surgery (instead of oral diabetes medicines) to make sure you have good blood sugar levels. The goal for blood sugar control after surgery is 80-180  mg/dL.    After your COVID test   You are not required to quarantine however you are required to wear a well-fitting mask when you are out and around people not in your household.  If your mask becomes wet or soiled, replace with a new one.  Wash your hands often with soap and water for 20 seconds or clean your hands with an alcohol-based hand sanitizer that contains at least 60% alcohol.  Do not share personal items.  Notify your provider: if you are in close contact with someone who has COVID  or if you develop a fever of 100.4 or greater, sneezing, cough, sore throat, shortness of breath or body aches.             Do not wear jewelry or makeup Do not wear lotions, powders, perfumes/colognes, or deodorant. Men may shave face and neck. Do not bring valuables to the hospital. DO Not wear nail polish, gel polish, artificial nails, or any other type of covering on natural nails including finger and toenails. If patients have artificial nails, gel coating, etc. that need to be removed by a nail salon, please have this removed prior to surgery or surgery may need to be canceled/delayed if the surgeon/ anesthesia feels like the patient is unable to be adequately monitored.             [facility] is not responsible for any belongings or valuables.  Do NOT Smoke (Tobacco/Vaping)  24 hours prior to your procedure  If you use a CPAP at night, you may bring your mask for your overnight stay.   Contacts, glasses, hearing aids, dentures or partials may not be worn into surgery, please bring cases for these belongings   For patients admitted to the hospital, discharge time will be determined by your treatment team.   Patients discharged the day of surgery will not be allowed to drive home, and someone needs to stay with them for 24 hours.  NO VISITORS WILL BE ALLOWED IN PRE-OP WHERE PATIENTS ARE PREPPED FOR SURGERY.  ONLY 1 SUPPORT PERSON MAY BE PRESENT IN THE WAITING ROOM WHILE YOU ARE IN  SURGERY.  IF YOU ARE TO BE ADMITTED, ONCE YOU ARE IN YOUR ROOM YOU WILL BE ALLOWED TWO (2) VISITORS. 1 (ONE) VISITOR MAY STAY OVERNIGHT BUT MUST ARRIVE TO THE ROOM BY 8pm.  Minor children may have two parents present. Special consideration for safety and communication needs will be reviewed on a case by case basis.  Special instructions:    Oral Hygiene is also important to reduce your risk of infection.  Remember - BRUSH YOUR TEETH THE MORNING OF SURGERY WITH YOUR REGULAR TOOTHPASTE   [facility]- Preparing For Surgery  Before surgery, you can play an important role. Because skin is not sterile, your skin needs to be as free of germs as possible. You can reduce the number of germs on your skin by washing with CHG (chlorahexidine gluconate) Soap before surgery.  CHG is an antiseptic cleaner which kills germs and bonds with the skin to continue killing germs even after washing.     Please do not use if you have an allergy to CHG or antibacterial soaps. If your skin becomes reddened/irritated stop using the CHG.  Do not shave (including legs and underarms) for at least 48 hours prior to first CHG shower. It is OK to shave your face.  Please follow these instructions carefully.     Shower the NIGHT BEFORE SURGERY and the MORNING OF SURGERY with CHG Soap.   If you chose to wash your hair, wash your hair first as usual with your normal shampoo. After you shampoo, rinse your hair and body thoroughly to remove the shampoo.  Then [employer] and genitals (private parts) with your normal soap and rinse thoroughly to remove soap.  After that Use CHG Soap as you would any other liquid soap. You can apply CHG directly to the skin and wash gently with a scrungie or a clean washcloth.   Apply the CHG Soap to your body ONLY FROM THE NECK DOWN.  Do not use on open wounds or open sores. Avoid contact with your eyes, ears, mouth and genitals (private parts). Wash Face and genitals (private parts)  with your  normal soap.   Wash thoroughly, paying special attention to the area where your surgery will be performed.  Thoroughly rinse your body with warm water from the neck down.  DO NOT shower/wash with your normal soap after using and rinsing off the CHG Soap.  Pat yourself dry with a CLEAN TOWEL.  Wear CLEAN PAJAMAS to bed the night before surgery  Place CLEAN SHEETS on your bed the night before your surgery  DO NOT SLEEP WITH PETS.   Day of Surgery: Take a shower with CHG soap. Wear Clean/Comfortable clothing the morning of surgery Do not apply any deodorants/lotions.   Remember to brush your teeth WITH YOUR REGULAR TOOTHPASTE.   Please read over the following fact sheets that you were given.

## 2021-10-29 ENCOUNTER — Other Ambulatory Visit: Payer: Self-pay

## 2021-10-29 ENCOUNTER — Encounter (HOSPITAL_COMMUNITY): Payer: Self-pay

## 2021-10-29 ENCOUNTER — Encounter (HOSPITAL_COMMUNITY)
Admission: RE | Admit: 2021-10-29 | Discharge: 2021-10-29 | Disposition: A | Discharge status: Home / Self Care | Payer: Medicare HMO | Source: Ambulatory Visit | Attending: Orthopedic Surgery | Admitting: Orthopedic Surgery

## 2021-10-29 VITALS — BP 111/80 | HR 62 | Temp 97.7°F | Resp 18 | Ht 68.0 in | Wt 217.0 lb

## 2021-10-29 DIAGNOSIS — I3481 Nonrheumatic mitral (valve) annulus calcification: Secondary | ICD-10-CM | POA: Insufficient documentation

## 2021-10-29 DIAGNOSIS — D860 Sarcoidosis of lung: Secondary | ICD-10-CM | POA: Insufficient documentation

## 2021-10-29 DIAGNOSIS — Z01812 Encounter for preprocedural laboratory examination: Secondary | ICD-10-CM | POA: Diagnosis present

## 2021-10-29 DIAGNOSIS — I2510 Atherosclerotic heart disease of native coronary artery without angina pectoris: Secondary | ICD-10-CM | POA: Insufficient documentation

## 2021-10-29 DIAGNOSIS — E119 Type 2 diabetes mellitus without complications: Secondary | ICD-10-CM | POA: Insufficient documentation

## 2021-10-29 DIAGNOSIS — Z01818 Encounter for other preprocedural examination: Secondary | ICD-10-CM

## 2021-10-29 DIAGNOSIS — I700 Atherosclerosis of aorta: Secondary | ICD-10-CM | POA: Insufficient documentation

## 2021-10-29 HISTORY — DX: Cerebral infarction, unspecified: I63.9

## 2021-10-29 HISTORY — DX: Polyneuropathy, unspecified: G62.9

## 2021-10-29 HISTORY — DX: Atherosclerotic heart disease of native coronary artery without angina pectoris: I25.10

## 2021-10-29 LAB — TYPE AND SCREEN
ABO/RH(D): A POS
Antibody Screen: NEGATIVE

## 2021-10-29 LAB — CBC WITH DIFFERENTIAL/PLATELET
Abs Immature Granulocytes: 0.03 10*3/uL (ref 0.00–0.07)
Basophils Absolute: 0 10*3/uL (ref 0.0–0.1)
Basophils Relative: 1 %
Eosinophils Absolute: 0.3 10*3/uL (ref 0.0–0.5)
Eosinophils Relative: 4 %
HCT: 43.4 % (ref 39.0–52.0)
Hemoglobin: 14.6 g/dL (ref 13.0–17.0)
Immature Granulocytes: 0 %
Lymphocytes Relative: 17 %
Lymphs Abs: 1.3 10*3/uL (ref 0.7–4.0)
MCH: 32.2 pg (ref 26.0–34.0)
MCHC: 33.6 g/dL (ref 30.0–36.0)
MCV: 95.8 fL (ref 80.0–100.0)
Monocytes Absolute: 0.6 10*3/uL (ref 0.1–1.0)
Monocytes Relative: 8 %
Neutro Abs: 5.1 10*3/uL (ref 1.7–7.7)
Neutrophils Relative %: 70 %
Platelets: 236 10*3/uL (ref 150–400)
RBC: 4.53 MIL/uL (ref 4.22–5.81)
RDW: 13 % (ref 11.5–15.5)
WBC: 7.2 10*3/uL (ref 4.0–10.5)
nRBC: 0 % (ref 0.0–0.2)

## 2021-10-29 LAB — URINALYSIS, ROUTINE W REFLEX MICROSCOPIC
Bacteria, UA: NONE SEEN
Bilirubin Urine: NEGATIVE
Glucose, UA: NEGATIVE mg/dL
Hgb urine dipstick: NEGATIVE
Ketones, ur: 5 mg/dL — AB
Nitrite: NEGATIVE
Protein, ur: NEGATIVE mg/dL
Specific Gravity, Urine: 1.013 (ref 1.005–1.030)
pH: 5 (ref 5.0–8.0)

## 2021-10-29 LAB — PROTIME-INR
INR: 1.2 (ref 0.8–1.2)
Prothrombin Time: 14.9 seconds (ref 11.4–15.2)

## 2021-10-29 LAB — COMPREHENSIVE METABOLIC PANEL
ALT: 44 U/L (ref 0–44)
AST: 30 U/L (ref 15–41)
Albumin: 3.9 g/dL (ref 3.5–5.0)
Alkaline Phosphatase: 57 U/L (ref 38–126)
Anion gap: 7 (ref 5–15)
BUN: 14 mg/dL (ref 8–23)
CO2: 29 mmol/L (ref 22–32)
Calcium: 9.5 mg/dL (ref 8.9–10.3)
Chloride: 101 mmol/L (ref 98–111)
Creatinine, Ser: 0.98 mg/dL (ref 0.61–1.24)
GFR, Estimated: >60 mL/min (ref >60)
Glucose, Bld: 112 mg/dL — ABNORMAL HIGH (ref 70–99)
Potassium: 4.3 mmol/L (ref 3.5–5.1)
Sodium: 137 mmol/L (ref 135–145)
Total Bilirubin: 1.7 mg/dL — ABNORMAL HIGH (ref 0.3–1.2)
Total Protein: 6.7 g/dL (ref 6.5–8.1)

## 2021-10-29 LAB — HEMOGLOBIN A1C
Hgb A1c MFr Bld: 6.4 % — ABNORMAL HIGH (ref 4.8–5.6)
Mean Plasma Glucose: 136.98 mg/dL

## 2021-10-29 LAB — SURGICAL PCR SCREEN
MRSA, PCR: NEGATIVE
Staphylococcus aureus: NEGATIVE

## 2021-10-29 LAB — GLUCOSE, CAPILLARY: Glucose-Capillary: 92 mg/dL (ref 70–99)

## 2021-10-29 LAB — APTT: aPTT: 33 seconds (ref 24–36)

## 2021-10-29 NOTE — Progress Notes (Signed)
Notified Butch Penny at [facility] office of abnormal urinalysis result.

## 2021-10-29 NOTE — Progress Notes (Signed)
PCP - [facility] Cardiologist - Lyman Hodgkins Urologist: Louis Meckel  PPM/ICD - denies   Chest x-ray - n/a EKG - 04/30/21 Stress Test - records requested ECHO - 08/27/21 Cardiac Cath - denies  Sleep Study - records requested CPAP - no longer wears it since he lost 40 lbs  Does not check CBGs at home  Patient instructed to hold all Aspirin, NSAID's, herbal medications, fish oil and vitamins 7 days prior to surgery.   ERAS Protcol -yes PRE-SURGERY Ensure or G2- g2 give  COVID TEST- ambulatory surgery   Anesthesia review: yes, records requested from [facility] in [place]  Patient denies shortness of breath, fever, cough and chest pain at PAT appointment   All instructions explained to the patient, with a verbal understanding of the material. Patient agrees to go over the instructions while at home for a better understanding. Patient also instructed to self quarantine after being tested for COVID-19. The opportunity to ask questions was provided.

## 2021-10-30 NOTE — Progress Notes (Signed)
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 He underwent a coronary CTA 07/2021 which showed predominantly diffuse mild three-vessel coronary artery disease with calcium score 1531 placing him in the 80th percentile for age/sex.  He underwent an echocardiogram 08/27/2021 which showed EF 60-65%, no R WMA, moderate concentric LVH, indeterminate LV diastolic function, no significant valvular dysfunction, and mild dilation of the ascending aorta at 42 mm.  He was last seen 09/20/2021 and continued aggressive risk factor modification was recommended.  He was advised to follow-up in 6 months.  History of pulmonary sarcoidosis, asymptomatic, stable per PCP notes.  DM2 well-controlled, A1c 6.4 on preop labs.  Preop labs reviewed, unremarkable.  EKG 04/30/2021: NSR.  Rate 62.  LAFB.  Coronary CTA 07/31/2021: IMPRESSION: 1. LM and 3 vessel calcium with score 1531 which is 60 th percentile for age / sex   2.  Severe mitral annular calcification   3.  Mildly dilated ascending thoracic aorta 3.9 cm   4.  Dilated mPA 3.6 cm with rPA proximally 3.1 cm and lPA 2.5 cm   5. See radiology report regarding numerous densely calcified mediastinal/ hilar lymph nodes? Consistent with sarcoid   6.  CAD RADS 2 non obstructive CAD see description above  Radiology over read: FINDINGS: Atherosclerotic calcifications in the thoracic aorta. Numerous densely calcified mediastinal and bilateral hilar lymph nodes are incidentally noted. Within the visualized portions of the thorax there are no suspicious appearing pulmonary nodules or masses, there is no acute consolidative airspace disease, no pleural effusions, no pneumothorax and no lymphadenopathy. Visualized portions of the upper abdomen incompletely image what appears to be a LapBand. There are no aggressive appearing lytic or blastic lesions noted in  the visualized portions of the skeleton.    Wynonia Musty [facility] Short Stay Center/Anesthesiology Phone [phone] 10/30/2021 3:41 PM

## 2021-10-30 NOTE — Anesthesia Preprocedure Evaluation (Addendum)
Anesthesia Evaluation  Patient identified by MRN, date of birth, ID band Patient awake    Reviewed: Allergy & Precautions, NPO status , Patient's Chart, lab work & pertinent test results  Airway Mallampati: II  TM Distance: >3 FB Neck ROM: Limited    Dental no notable dental hx.    Pulmonary former smoker,  Pulmonary sarcoidosis, no sx's   Pulmonary exam normal breath sounds clear to auscultation       Cardiovascular hypertension, Pt. on medications Normal cardiovascular exam Rhythm:Regular Rate:Normal     Neuro/Psych CVA, No Residual Symptoms negative psych ROS   GI/Hepatic negative GI ROS, Neg liver ROS,   Endo/Other  diabetes, Type 2  Renal/GU negative Renal ROS  negative genitourinary   Musculoskeletal negative musculoskeletal ROS (+)   Abdominal   Peds negative pediatric ROS (+)  Hematology negative hematology ROS (+)   Anesthesia Other Findings   Reproductive/Obstetrics negative OB ROS                            Anesthesia Physical Anesthesia Plan  ASA: 3  Anesthesia Plan: General   Post-op Pain Management: Minimal or no pain anticipated   Induction: Intravenous  PONV Risk Score and Plan: 2 and Ondansetron, Dexamethasone and Treatment may vary due to age or medical condition  Airway Management Planned: Oral ETT and Video Laryngoscope Planned  Additional Equipment:   Intra-op Plan:   Post-operative Plan: Extubation in OR  Informed Consent: I have reviewed the patients History and Physical, chart, labs and discussed the procedure including the risks, benefits and alternatives for the proposed anesthesia with the patient or authorized representative who has indicated his/her understanding and acceptance.     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 He underwent a coronary CTA 07/2021 which showed predominantly diffuse mild three-vessel coronary artery disease with calcium score 1531 placing him in the 80th percentile for age/sex. He underwent an echocardiogram 08/27/2021 which showed EF 60-65%, no R WMA, moderate concentric LVH, indeterminate LV diastolic function, no significant valvular dysfunction, and mild dilation of the ascending aorta at 42 mm.  He was last seen 09/20/2021 and continued aggressive risk factor modification was recommended.  He was advised to follow-up in 6 months.  History of pulmonary sarcoidosis, asymptomatic, stable per PCP notes.  DM2 well-controlled, A1c 6.4 on preop labs.  Preop labs reviewed, unremarkable.  EKG 04/30/2021: NSR.  Rate 62.  LAFB.  Coronary CTA 07/31/2021: IMPRESSION: 1. LM and 3 vessel calcium with score 1531 which is 76 th percentile for age / sex  2. Severe mitral annular calcification  3. Mildly dilated ascending thoracic aorta 3.9 cm  4. Dilated mPA 3.6 cm with rPA proximally 3.1 cm and lPA 2.5 cm  5. See radiology report regarding numerous densely calcified mediastinal/ hilar lymph nodes? Consistent with sarcoid  6. CAD RADS 2 non obstructive CAD see description above  Radiology over read: FINDINGS: Atherosclerotic calcifications in the thoracic aorta. Numerous densely calcified mediastinal and bilateral hilar lymph nodes are incidentally noted. Within the visualized portions of the thorax there are no suspicious appearing pulmonary nodules or masses, there is no acute consolidative airspace disease, no pleural effusions, no pneumothorax and no lymphadenopathy. Visualized portions of the upper abdomen incompletely image what appears to be a LapBand. There  are no aggressive appearing lytic or blastic lesions noted in the visualized portions of the skeleton.  )       Anesthesia Quick  Evaluation

## 2021-11-06 ENCOUNTER — Encounter (HOSPITAL_COMMUNITY)
Admission: RE | Disposition: A | Discharge status: Home / Self Care | Payer: Self-pay | Source: Ambulatory Visit | Attending: Orthopedic Surgery

## 2021-11-06 ENCOUNTER — Ambulatory Visit (HOSPITAL_COMMUNITY): Payer: Medicare HMO | Admitting: Anesthesiology

## 2021-11-06 ENCOUNTER — Encounter (HOSPITAL_COMMUNITY): Payer: Self-pay | Admitting: Orthopedic Surgery

## 2021-11-06 ENCOUNTER — Ambulatory Visit (HOSPITAL_COMMUNITY): Payer: Medicare HMO | Admitting: Physician Assistant

## 2021-11-06 ENCOUNTER — Ambulatory Visit (HOSPITAL_COMMUNITY): Payer: Medicare HMO

## 2021-11-06 ENCOUNTER — Observation Stay (HOSPITAL_COMMUNITY)
Admission: RE | Admit: 2021-11-06 | Discharge: 2021-11-07 | Disposition: A | Discharge status: Home / Self Care | Payer: Medicare HMO | Source: Ambulatory Visit | Attending: Orthopedic Surgery | Admitting: Orthopedic Surgery

## 2021-11-06 ENCOUNTER — Other Ambulatory Visit: Payer: Self-pay

## 2021-11-06 DIAGNOSIS — M8088XA Other osteoporosis with current pathological fracture, vertebra(e), initial encounter for fracture: Secondary | ICD-10-CM | POA: Diagnosis present

## 2021-11-06 DIAGNOSIS — Z79899 Other long term (current) drug therapy: Secondary | ICD-10-CM | POA: Diagnosis not present

## 2021-11-06 DIAGNOSIS — Z7984 Long term (current) use of oral hypoglycemic drugs: Secondary | ICD-10-CM | POA: Diagnosis not present

## 2021-11-06 DIAGNOSIS — E119 Type 2 diabetes mellitus without complications: Secondary | ICD-10-CM | POA: Insufficient documentation

## 2021-11-06 DIAGNOSIS — D869 Sarcoidosis, unspecified: Secondary | ICD-10-CM | POA: Diagnosis not present

## 2021-11-06 DIAGNOSIS — S22000A Wedge compression fracture of unspecified thoracic vertebra, initial encounter for closed fracture: Secondary | ICD-10-CM | POA: Diagnosis present

## 2021-11-06 DIAGNOSIS — Z85828 Personal history of other malignant neoplasm of skin: Secondary | ICD-10-CM | POA: Insufficient documentation

## 2021-11-06 DIAGNOSIS — I2510 Atherosclerotic heart disease of native coronary artery without angina pectoris: Secondary | ICD-10-CM | POA: Insufficient documentation

## 2021-11-06 DIAGNOSIS — Z87891 Personal history of nicotine dependence: Secondary | ICD-10-CM | POA: Diagnosis not present

## 2021-11-06 DIAGNOSIS — Z8673 Personal history of transient ischemic attack (TIA), and cerebral infarction without residual deficits: Secondary | ICD-10-CM | POA: Insufficient documentation

## 2021-11-06 DIAGNOSIS — I10 Essential (primary) hypertension: Secondary | ICD-10-CM | POA: Diagnosis not present

## 2021-11-06 DIAGNOSIS — Z419 Encounter for procedure for purposes other than remedying health state, unspecified: Secondary | ICD-10-CM

## 2021-11-06 HISTORY — PX: KYPHOPLASTY: SHX5884

## 2021-11-06 LAB — GLUCOSE, CAPILLARY
Glucose-Capillary: 126 mg/dL — ABNORMAL HIGH (ref 70–99)
Glucose-Capillary: 111 mg/dL — ABNORMAL HIGH (ref 70–99)
Glucose-Capillary: 107 mg/dL — ABNORMAL HIGH (ref 70–99)
Glucose-Capillary: 141 mg/dL — ABNORMAL HIGH (ref 70–99)
Glucose-Capillary: 207 mg/dL — ABNORMAL HIGH (ref 70–99)

## 2021-11-06 SURGERY — KYPHOPLASTY
Anesthesia: General

## 2021-11-06 MED ORDER — CARBIDOPA-LEVODOPA 25-100 MG PO TABS
1.0000 | ORAL_TABLET | Freq: Every day | ORAL | Status: DC
  Administered 2021-11-06: 22:00:00 1 via ORAL
  Filled 2021-11-06: qty 1

## 2021-11-06 MED ORDER — INSULIN ASPART 100 UNIT/ML IJ SOLN
0.0000 [IU] | Freq: Every day | INTRAMUSCULAR | Status: DC
  Administered 2021-11-06: 23:00:00 2 [IU] via SUBCUTANEOUS

## 2021-11-06 MED ORDER — ATORVASTATIN CALCIUM 40 MG PO TABS
40.0000 mg | ORAL_TABLET | Freq: Every day | ORAL | Status: DC
  Administered 2021-11-07: 08:00:00 40 mg via ORAL
  Filled 2021-11-06: qty 1

## 2021-11-06 MED ORDER — ONDANSETRON HCL 4 MG/2ML IJ SOLN: 4.0000 mg | Freq: Four times a day (QID) | INTRAMUSCULAR | Status: DC | PRN

## 2021-11-06 MED ORDER — ONDANSETRON HCL 4 MG/2ML IJ SOLN
INTRAMUSCULAR | Status: DC | PRN
  Administered 2021-11-06: 4 mg via INTRAVENOUS

## 2021-11-06 MED ORDER — TRAMADOL HCL 50 MG PO TABS
50.0000 mg | ORAL_TABLET | ORAL | Status: DC | PRN
  Administered 2021-11-06: 21:00:00 50 mg via ORAL
  Filled 2021-11-06: qty 1

## 2021-11-06 MED ORDER — SODIUM CHLORIDE 0.9% FLUSH: 3.0000 mL | INTRAVENOUS | Status: DC | PRN

## 2021-11-06 MED ORDER — PROPOFOL 10 MG/ML IV BOLUS
INTRAVENOUS | Status: AC
  Filled 2021-11-06: qty 20

## 2021-11-06 MED ORDER — LACTATED RINGERS IV SOLN: INTRAVENOUS | Status: DC

## 2021-11-06 MED ORDER — ROCURONIUM BROMIDE 100 MG/10ML IV SOLN
INTRAVENOUS | Status: DC | PRN
  Administered 2021-11-06: 50 mg via INTRAVENOUS

## 2021-11-06 MED ORDER — DIPHENHYDRAMINE HCL 12.5 MG/5ML PO ELIX
ORAL_SOLUTION | ORAL | Status: AC
  Filled 2021-11-06: qty 10

## 2021-11-06 MED ORDER — DEXAMETHASONE SODIUM PHOSPHATE 4 MG/ML IJ SOLN
INTRAMUSCULAR | Status: DC | PRN
  Administered 2021-11-06: 4 mg via INTRAVENOUS

## 2021-11-06 MED ORDER — FENTANYL CITRATE (PF) 100 MCG/2ML IJ SOLN: 25.0000 ug | INTRAMUSCULAR | Status: DC | PRN

## 2021-11-06 MED ORDER — METHOCARBAMOL 1000 MG/10ML IJ SOLN
500.0000 mg | Freq: Four times a day (QID) | INTRAVENOUS | Status: DC | PRN
  Filled 2021-11-06: qty 5

## 2021-11-06 MED ORDER — BACITRACIN 500 UNIT/GM EX OINT
TOPICAL_OINTMENT | CUTANEOUS | Status: DC | PRN
  Administered 2021-11-06: 1 via TOPICAL

## 2021-11-06 MED ORDER — FENTANYL CITRATE (PF) 250 MCG/5ML IJ SOLN
INTRAMUSCULAR | Status: AC
  Filled 2021-11-06: qty 5

## 2021-11-06 MED ORDER — DIPHENHYDRAMINE HCL 12.5 MG/5ML PO LIQD
12.5000 mg | Freq: Once | ORAL | Status: AC
  Administered 2021-11-06: 19:00:00 12.5 mg via ORAL

## 2021-11-06 MED ORDER — INSULIN ASPART 100 UNIT/ML IJ SOLN
0.0000 [IU] | Freq: Three times a day (TID) | INTRAMUSCULAR | Status: DC
Start: 2021-11-07 — End: 2021-11-07
  Administered 2021-11-07: 08:00:00 3 [IU] via SUBCUTANEOUS

## 2021-11-06 MED ORDER — FENTANYL CITRATE (PF) 250 MCG/5ML IJ SOLN
INTRAMUSCULAR | Status: DC | PRN
  Administered 2021-11-06: 50 ug via INTRAVENOUS
  Administered 2021-11-06: 100 ug via INTRAVENOUS
  Administered 2021-11-06: 25 ug via INTRAVENOUS

## 2021-11-06 MED ORDER — METFORMIN HCL 500 MG PO TABS
500.0000 mg | ORAL_TABLET | Freq: Two times a day (BID) | ORAL | Status: DC
  Administered 2021-11-07: 08:00:00 500 mg via ORAL
  Filled 2021-11-06: qty 1

## 2021-11-06 MED ORDER — OXYCODONE HCL 5 MG/5ML PO SOLN: 5.0000 mg | Freq: Once | ORAL | Status: DC | PRN

## 2021-11-06 MED ORDER — ASPIRIN EC 81 MG PO TBEC
81.0000 mg | DELAYED_RELEASE_TABLET | Freq: Every day | ORAL | Status: DC
  Administered 2021-11-07: 08:00:00 81 mg via ORAL
  Filled 2021-11-06: qty 1

## 2021-11-06 MED ORDER — CHLORHEXIDINE GLUCONATE 0.12 % MT SOLN: 15.0000 mL | Freq: Once | OROMUCOSAL | Status: AC

## 2021-11-06 MED ORDER — METHYLPREDNISOLONE SODIUM SUCC 125 MG IJ SOLR
INTRAMUSCULAR | Status: AC
  Filled 2021-11-06: qty 2

## 2021-11-06 MED ORDER — 0.9 % SODIUM CHLORIDE (POUR BTL) OPTIME
TOPICAL | Status: DC | PRN
  Administered 2021-11-06: 17:00:00 1000 mL

## 2021-11-06 MED ORDER — CEFAZOLIN SODIUM-DEXTROSE 2-4 GM/100ML-% IV SOLN
2.0000 g | INTRAVENOUS | Status: AC
  Administered 2021-11-06: 17:00:00 2 g via INTRAVENOUS

## 2021-11-06 MED ORDER — DIPHENHYDRAMINE HCL 25 MG PO CAPS
25.0000 mg | ORAL_CAPSULE | Freq: Four times a day (QID) | ORAL | Status: DC | PRN
Start: 2021-11-06 — End: 2021-11-07

## 2021-11-06 MED ORDER — BUPIVACAINE-EPINEPHRINE (PF) 0.25% -1:200000 IJ SOLN
INTRAMUSCULAR | Status: AC
  Filled 2021-11-06: qty 30

## 2021-11-06 MED ORDER — GABAPENTIN 600 MG PO TABS
600.0000 mg | ORAL_TABLET | Freq: Two times a day (BID) | ORAL | Status: DC
  Administered 2021-11-06 – 2021-11-07 (×2): 600 mg via ORAL
  Filled 2021-11-06 (×2): qty 1

## 2021-11-06 MED ORDER — CHLORHEXIDINE GLUCONATE 0.12 % MT SOLN
OROMUCOSAL | Status: AC
  Administered 2021-11-06: 13:00:00 15 mL via OROMUCOSAL
  Filled 2021-11-06: qty 15

## 2021-11-06 MED ORDER — METHYLPREDNISOLONE SODIUM SUCC 125 MG IJ SOLR
125.0000 mg | Freq: Once | INTRAMUSCULAR | Status: AC
  Administered 2021-11-06: 19:00:00 125 mg via INTRAVENOUS

## 2021-11-06 MED ORDER — LOSARTAN POTASSIUM 50 MG PO TABS
25.0000 mg | ORAL_TABLET | Freq: Every morning | ORAL | Status: DC
  Administered 2021-11-07: 08:00:00 25 mg via ORAL
  Filled 2021-11-06: qty 1

## 2021-11-06 MED ORDER — ONDANSETRON HCL 4 MG/2ML IJ SOLN: 4.0000 mg | Freq: Once | INTRAMUSCULAR | Status: DC | PRN

## 2021-11-06 MED ORDER — POTASSIUM CHLORIDE IN NACL 20-0.9 MEQ/L-% IV SOLN: INTRAVENOUS | Status: DC

## 2021-11-06 MED ORDER — SUCCINYLCHOLINE CHLORIDE 200 MG/10ML IV SOSY
PREFILLED_SYRINGE | INTRAVENOUS | Status: DC | PRN
  Administered 2021-11-06: 150 mg via INTRAVENOUS

## 2021-11-06 MED ORDER — TRAZODONE HCL 50 MG PO TABS
100.0000 mg | ORAL_TABLET | Freq: Every day | ORAL | Status: DC
  Administered 2021-11-06: 21:00:00 100 mg via ORAL
  Filled 2021-11-06: qty 2

## 2021-11-06 MED ORDER — PHENYLEPHRINE HCL-NACL 20-0.9 MG/250ML-% IV SOLN
INTRAVENOUS | Status: DC | PRN
  Administered 2021-11-06: 40 ug/min via INTRAVENOUS

## 2021-11-06 MED ORDER — ACETAMINOPHEN 10 MG/ML IV SOLN: 1000.0000 mg | Freq: Once | INTRAVENOUS | Status: DC | PRN

## 2021-11-06 MED ORDER — ONDANSETRON HCL 4 MG PO TABS: 4.0000 mg | ORAL_TABLET | Freq: Four times a day (QID) | ORAL | Status: DC | PRN

## 2021-11-06 MED ORDER — OXYCODONE HCL 5 MG PO TABS: 5.0000 mg | ORAL_TABLET | Freq: Once | ORAL | Status: DC | PRN

## 2021-11-06 MED ORDER — IOPAMIDOL (ISOVUE-300) INJECTION 61%
INTRAVENOUS | Status: DC | PRN
  Administered 2021-11-06: 17:00:00 100 mL

## 2021-11-06 MED ORDER — METHOCARBAMOL 500 MG PO TABS
500.0000 mg | ORAL_TABLET | Freq: Four times a day (QID) | ORAL | Status: DC | PRN
  Administered 2021-11-07: 04:00:00 500 mg via ORAL
  Filled 2021-11-06: qty 1

## 2021-11-06 MED ORDER — CEFAZOLIN SODIUM-DEXTROSE 2-4 GM/100ML-% IV SOLN
INTRAVENOUS | Status: AC
  Filled 2021-11-06: qty 100

## 2021-11-06 MED ORDER — BACITRACIN ZINC 500 UNIT/GM EX OINT
TOPICAL_OINTMENT | CUTANEOUS | Status: AC
  Filled 2021-11-06: qty 28.35

## 2021-11-06 MED ORDER — POVIDONE-IODINE 7.5 % EX SOLN
Freq: Once | CUTANEOUS | Status: DC
  Filled 2021-11-06: qty 118

## 2021-11-06 MED ORDER — PROPOFOL 10 MG/ML IV BOLUS
INTRAVENOUS | Status: DC | PRN
  Administered 2021-11-06: 150 mg via INTRAVENOUS

## 2021-11-06 MED ORDER — ORAL CARE MOUTH RINSE: 15.0000 mL | Freq: Once | OROMUCOSAL | Status: AC

## 2021-11-06 MED ORDER — ZOLPIDEM TARTRATE 5 MG PO TABS: 5.0000 mg | ORAL_TABLET | Freq: Every evening | ORAL | Status: DC | PRN

## 2021-11-06 MED ORDER — SUGAMMADEX SODIUM 200 MG/2ML IV SOLN
INTRAVENOUS | Status: DC | PRN
  Administered 2021-11-06: 200 mg via INTRAVENOUS

## 2021-11-06 MED ORDER — TRAMADOL HCL 50 MG PO TABS: 50.0000 mg | ORAL_TABLET | ORAL | 2 refills | Status: DC | PRN

## 2021-11-06 MED ORDER — GLYCOPYRROLATE 0.2 MG/ML IJ SOLN
INTRAMUSCULAR | Status: DC | PRN
  Administered 2021-11-06: .2 mg via INTRAVENOUS

## 2021-11-06 SURGICAL SUPPLY — 48 items
BAG COUNTER SPONGE SURGICOUNT (BAG) ×2 IMPLANT
BLADE SURG 15 STRL LF DISP TIS (BLADE) ×1 IMPLANT
BLADE SURG 15 STRL SS (BLADE) ×1
CEMENT KYPHON C01A KIT/MIXER (Cement) ×1 IMPLANT
COVER MAYO STAND STRL (DRAPES) ×2 IMPLANT
COVER SURGICAL LIGHT HANDLE (MISCELLANEOUS) ×2 IMPLANT
CURETTE EXPRESS SZ2 7MM (INSTRUMENTS) IMPLANT
CURRETTE EXPRESS SZ2 7MM (INSTRUMENTS)
DRAPE C-ARM 42X72 X-RAY (DRAPES) ×2 IMPLANT
DRAPE HALF SHEET 40X57 (DRAPES) IMPLANT
DRAPE INCISE IOBAN 66X45 STRL (DRAPES) ×2 IMPLANT
DRAPE LAPAROTOMY T 102X78X121 (DRAPES) ×2 IMPLANT
DRAPE SURG 17X23 STRL (DRAPES) ×8 IMPLANT
DRAPE WARM FLUID 44X44 (DRAPES) ×2 IMPLANT
DURAPREP 26ML APPLICATOR (WOUND CARE) ×2 IMPLANT
GAUZE 4X4 16PLY ~~LOC~~+RFID DBL (SPONGE) ×2 IMPLANT
GAUZE SPONGE 2X2 8PLY STRL LF (GAUZE/BANDAGES/DRESSINGS) ×1 IMPLANT
GAUZE SPONGE 4X4 12PLY STRL (GAUZE/BANDAGES/DRESSINGS) ×1 IMPLANT
GLOVE SRG 8 PF TXTR STRL LF DI (GLOVE) ×1 IMPLANT
GLOVE SURG ENC MOIS LTX SZ6.5 (GLOVE) ×2 IMPLANT
GLOVE SURG ENC MOIS LTX SZ8 (GLOVE) ×2 IMPLANT
GLOVE SURG UNDER POLY LF SZ7 (GLOVE) ×2 IMPLANT
GLOVE SURG UNDER POLY LF SZ8 (GLOVE) ×1
GOWN STRL REUS W/ TWL LRG LVL3 (GOWN DISPOSABLE) ×2 IMPLANT
GOWN STRL REUS W/ TWL XL LVL3 (GOWN DISPOSABLE) ×1 IMPLANT
GOWN STRL REUS W/TWL LRG LVL3 (GOWN DISPOSABLE) ×2
GOWN STRL REUS W/TWL XL LVL3 (GOWN DISPOSABLE) ×1
INTRODUCER DEVICE OSTEO LEVEL (INTRODUCER) ×1 IMPLANT
KIT BASIN OR (CUSTOM PROCEDURE TRAY) ×2 IMPLANT
KIT TURNOVER KIT B (KITS) ×2 IMPLANT
NDL HYPO 25X1 1.5 SAFETY (NEEDLE) ×1 IMPLANT
NDL SPNL 18GX3.5 QUINCKE PK (NEEDLE) ×2 IMPLANT
NEEDLE 22X1 1/2 (OR ONLY) (NEEDLE) IMPLANT
NEEDLE HYPO 25X1 1.5 SAFETY (NEEDLE) ×2 IMPLANT
NEEDLE SPNL 18GX3.5 QUINCKE PK (NEEDLE) ×4 IMPLANT
NS IRRIG 1000ML POUR BTL (IV SOLUTION) ×2 IMPLANT
PACK UNIVERSAL I (CUSTOM PROCEDURE TRAY) ×2 IMPLANT
PAD ARMBOARD 7.5X6 YLW CONV (MISCELLANEOUS) ×4 IMPLANT
POSITIONER HEAD PRONE TRACH (MISCELLANEOUS) ×2 IMPLANT
SPONGE GAUZE 2X2 STER 10/PKG (GAUZE/BANDAGES/DRESSINGS) ×1
SUT MNCRL AB 4-0 PS2 18 (SUTURE) ×2 IMPLANT
SYR BULB IRRIG 60ML STRL (SYRINGE) ×2 IMPLANT
SYR CONTROL 10ML LL (SYRINGE) ×2 IMPLANT
TAPE CLOTH SURG 4X10 WHT LF (GAUZE/BANDAGES/DRESSINGS) ×1 IMPLANT
TOWEL GREEN STERILE (TOWEL DISPOSABLE) ×2 IMPLANT
TOWEL GREEN STERILE FF (TOWEL DISPOSABLE) ×2 IMPLANT
TRAY KYPHOPAK 15/3 ONESTEP 1ST (MISCELLANEOUS) ×2 IMPLANT
TRAY KYPHOPAK 20/3 ONESTEP 1ST (MISCELLANEOUS) IMPLANT

## 2021-11-06 NOTE — Anesthesia Procedure Notes (Signed)
Procedure Name: Intubation Date/Time: 11/06/2021 4:21 PM Performed by: Josephine Igo, CRNA Pre-anesthesia Checklist: Emergency Drugs available, Suction available, Patient identified and Patient being monitored Patient Re-evaluated:Patient Re-evaluated prior to induction Oxygen Delivery Method: Circle system utilized Preoxygenation: Pre-oxygenation with 100% oxygen Induction Type: IV induction Ventilation: Mask ventilation without difficulty and Oral airway inserted - appropriate to patient size Laryngoscope Size: Glidescope and 4 Grade View: Grade I Tube type: Oral Tube size: 7.5 mm Number of attempts: 1 Airway Equipment and Method: Stylet and Video-laryngoscopy Placement Confirmation: ETT inserted through vocal cords under direct vision and positive ETCO2 Secured at: 23 cm Tube secured with: Tape Dental Injury: Teeth and Oropharynx as per pre-operative assessment

## 2021-11-06 NOTE — H&P (Signed)
PREOPERATIVE H&P  Chief Complaint: Mid back pain  HPI: Max Ryan is a 75 y.o. 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   Past Surgical History:  Procedure Laterality Date   ANTERIOR CERVICAL DECOMPRESSION/DISCECTOMY FUSION 4 LEVELS N/A 08/17/2019   Procedure: ANTERIOR CERVICAL DECOMPRESSION FUSION CERVICAL THREE-FOUR, CERVICAL FOUR-FIVE, CERVICAL FIVE-SIX, CERVICAL SIX-SEVEN WITH INSTRUMENTATION AND ALLOGRAFT;  Surgeon: Phylliss Bob, MD;  Location: [facility];  Service: Orthopedics;  Laterality: N/A;   CARPAL TUNNEL RELEASE Right    CHOLECYSTECTOMY     COLONOSCOPY     CYSTOSCOPY/URETEROSCOPY/HOLMIUM LASER/STENT PLACEMENT Right 07/19/2019   Procedure: CYSTOSCOPY/URETEROSCOPY/HOLMIUM LASER/STENT PLACEMENT;  Surgeon: Ardis Hughs, MD;  Location: [facility];  Service: Urology;  Laterality: Right;   HERNIA REPAIR     JOINT REPLACEMENT Left    knee   KNEE SURGERY Left    LAPAROSCOPIC ROUX-EN-Y GASTRIC BYPASS WITH UPPER ENDOSCOPY AND REMOVAL OF LAP BAND     POSTERIOR CERVICAL FUSION/FORAMINOTOMY N/A 04/26/2020   Procedure: POSTERIOR CERVICAL DECOMPRESSION CERVICAL SIX- THORACIC ONE;  Surgeon: Phylliss Bob, MD;  Location: [facility];  Service: Orthopedics;  Laterality: N/A;   SPINAL FUSION  2004   TONSILLECTOMY     Social History   Socioeconomic History   Marital status: Married    Spouse name: Not on file   Number of children: Not on file   Years of  education: Not on file   Highest education level: Not on file  Occupational History   Not on file  Tobacco Use   Smoking status: Former    Types: Cigarettes    Quit date: 2005    Years since quitting: 18.0   Smokeless tobacco: Never  Vaping Use   Vaping Use: Never used  Substance and Sexual Activity   Alcohol use: Not Currently    Alcohol/week: 42.0 standard drinks    Types: 42 Glasses of wine per week    Comment: "Haven't had a drink since 9/1 2022" - per pt   Drug use: Yes    Types: Marijuana    Comment: occ   Sexual activity: Not on file  Other Topics Concern   Not on file  Social History Narrative   Not on file   Social Determinants of Health   Financial Resource Strain: Not on file  Food Insecurity: Not on file  Transportation Needs: Not on file  Physical Activity: Not on file  Stress: Not on file  Social Connections: Not on file   Family History  Problem Relation Age of Onset   CAD Father    No Known Allergies Prior to Admission medications   Medication Sig Start Date End Date Taking? Authorizing Provider  aspirin EC 81 MG tablet Take 81 mg by mouth daily. Swallow whole.   Yes [provider]  atorvastatin (LIPITOR) 40 MG tablet Take 1 tablet (40 mg total) by mouth daily.  09/20/21 12/19/21 Yes Kroeger, Daleen Snook M., PA-C  carbidopa-levodopa (SINEMET IR) 25-100 MG tablet Take 1 tablet by mouth at bedtime. 07/03/19  Yes [provider]  gabapentin (NEURONTIN) 600 MG tablet Take 600 mg by mouth 2 (two) times daily.   Yes [provider]  losartan (COZAAR) 25 MG tablet Take 25 mg by mouth in the morning.   Yes [provider]  metFORMIN (GLUCOPHAGE) 500 MG tablet Take 500 mg by mouth 2 (two) times daily.   Yes [provider]  traMADol (ULTRAM) 50 MG tablet Take 50 mg by mouth 3 (three) times daily as needed for pain. 10/08/21  Yes [provider]  traZODone (DESYREL) 100 MG tablet Take 100 mg by mouth at bedtime.  09/01/21  Yes [provider]     All other systems have been reviewed and were otherwise negative with the exception of those mentioned in the HPI and as above.  Physical Exam: Vitals:   11/06/21 1211  BP: (!) 141/86  Pulse: 71  Resp: 18  Temp: (!) 97.4 F (36.3 C)  SpO2: 95%    Body mass index is 32.69 kg/m.  General: Alert, no acute distress Cardiovascular: No pedal edema Respiratory: No cyanosis, no use of accessory musculature Skin: No lesions in the area of chief complaint Neurologic: Sensation intact distally Psychiatric: Patient is competent for consent with normal mood and affect Lymphatic: No axillary or cervical lymphadenopathy   Assessment/Plan: T8 AND T9 COMPRESSION FRACTURES Plan for Procedure(s): THORACIC EIGHT, THORACIC NINE KHYPHOPLASTY   [name], MD 11/06/2021 3:22 PM

## 2021-11-06 NOTE — Progress Notes (Signed)
Patient ready for discharge. Upon dressing patient large welts noted on patients torso, groin region and pink rash with small hives to upper chest. Patient states that the itching has remained the same post med administration. Vital signs remain stable, No c/o difficulty swallowing or breathing during this episode. Dr Meredith Mody and Dr Lynann Bologna notified of current status.  Medication orders received and implemented. Dr Meredith Mody to bedside. Family notified of current status. Will continue to monitor.

## 2021-11-06 NOTE — Op Note (Signed)
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  SURGEON:  Phylliss Bob, MD.  ASSISTANT:  None  ANESTHESIA:  General endotracheal anesthesia.  COMPLICATIONS:  None.  DISPOSITION:  Stable.  ESTIMATED BLOOD LOSS:  Minimal.  INDICATIONS FOR SURGERY:  Briefly, Mr. Simington is a very pleasant 75- year-old male, who did have an onset of pain in his low back.   Her pain was rather severe.  The patient's imaging studies did clearly reveal a T8 and T9 compression fracture. We did attempt a trial of nonoperative treatment, but the patient continued to feel rather debilitated as a result of her ongoing pain.  Given her ongoing pain and dysfunction, we did discuss proceeding with the procedure noted above.  I did fully discuss the procedure with the patient, and she did wish to proceed.  OPERATIVE DETAILS:  On 11/06/2021, the patient was brought to surgery and general endotracheal anesthesia was administered.  The patient was placed prone on a well-padded flat Jackson bed with gel rolls placed under the patient's chest and hips.  Antibiotics were given.  AP and lateral fluoroscopy was brought into the field.  The T8 and T9 pedicles were marked out in the usual fashion.  After a time-out procedure was performed, I did advance Jamshidis across the T8 and T9 pedicles on the right and left sides.  I then drilled through the Jamshidis.  I then inserted kyphoplasty balloons and I was able to inflate the balloons with approximately 2cc of contrast at each level.  At this point, after the cement was mixed, a total of approximately 4cc of cement was injected at each level,  half on the right, and half on the left.  Excellent interdigitation of cement was identified. There was no abnormal extravasation of cement identified.  The cement was then allowed to harden, after which point the Jamshidis were removed.  The wound  was then irrigated and closed using 4-0 Monocryl.  Bacitracin and a sterile dressing were applied.  The patient was then awoken from general endotracheal anesthesia and transferred to recovery in stable condition.   Phylliss Bob, MD

## 2021-11-06 NOTE — Transfer of Care (Addendum)
Immediate Anesthesia Transfer of Care Note  Patient: Max Ryan  Procedure(s) Performed: THORACIC EIGHT, THORACIC NINE [place]  Patient Location: PACU  Anesthesia Type:General  Level of Consciousness: drowsy  Airway & Oxygen Therapy: Patient Spontanous Breathing and Patient connected to face mask oxygen  Post-op Assessment: Report given to RN and Post -op Vital signs reviewed and stable  Post vital signs: Reviewed and stable  Last Vitals:  Vitals Value Taken Time  BP 121/79 11/06/21 1802  Temp    Pulse 61 11/06/21 1805  Resp 15 11/06/21 1805  SpO2 95 % 11/06/21 1805  Vitals shown include unvalidated device data.  Last Pain:  Vitals:   11/06/21 1237  TempSrc:   PainSc: 0-No pain      Patients Stated Pain Goal: 3 (81/18/86 7737)  Complications: No notable events documented.

## 2021-11-06 NOTE — Progress Notes (Addendum)
Patient C/o itching to torso at 1830 this evening. Stated that he has had this for the last 30 days off and on but is more intense currently. Md notified and orders received and implemented.

## 2021-11-07 ENCOUNTER — Encounter (HOSPITAL_COMMUNITY): Payer: Self-pay | Admitting: Orthopedic Surgery

## 2021-11-07 DIAGNOSIS — M8088XA Other osteoporosis with current pathological fracture, vertebra(e), initial encounter for fracture: Secondary | ICD-10-CM | POA: Diagnosis not present

## 2021-11-07 DIAGNOSIS — Z87891 Personal history of nicotine dependence: Secondary | ICD-10-CM | POA: Diagnosis not present

## 2021-11-07 DIAGNOSIS — Z7984 Long term (current) use of oral hypoglycemic drugs: Secondary | ICD-10-CM | POA: Diagnosis not present

## 2021-11-07 DIAGNOSIS — E119 Type 2 diabetes mellitus without complications: Secondary | ICD-10-CM | POA: Diagnosis not present

## 2021-11-07 LAB — GLUCOSE, CAPILLARY: Glucose-Capillary: 186 mg/dL — ABNORMAL HIGH (ref 70–99)

## 2021-11-07 NOTE — Progress Notes (Signed)
° ° °  Patient doing well  Itching has improved   Physical Exam: Vitals:   11/07/21 0421 11/07/21 0444  BP: 92/62 101/72  Pulse: 86 80  Resp: 20 20  Temp: 98.6 F (37 C) 98.5 F (36.9 C)  SpO2: 95% 94%    Dressing in place NVI  POD #1 s/p kyphoplasty, admitted for itching and skin blistering with unclear etiology. This has imporved.   - encourage ambulation - ultram for pain, Robaxin for muscle spasms - d/c home today with f/u in 2 weeks

## 2021-11-07 NOTE — Progress Notes (Signed)
Discharge instructions/education/AVS/Rx given to patient and verbalized understanding. MAE and ambulating well independently. Pain is mild to moderate and controlled by PRN meds. Voiding and emptying bladder well. Patient awaiting for transport.

## 2021-11-08 NOTE — Anesthesia Postprocedure Evaluation (Signed)
Anesthesia Post Note  Patient: Max Ryan  Procedure(s) Performed: THORACIC EIGHT, THORACIC NINE [place]     Patient location during evaluation: PACU Anesthesia Type: General Level of consciousness: awake and alert Pain management: pain level controlled Vital Signs Assessment: post-procedure vital signs reviewed and stable Respiratory status: spontaneous breathing, nonlabored ventilation, respiratory function stable and patient connected to nasal cannula oxygen Cardiovascular status: blood pressure returned to baseline and stable Postop Assessment: no apparent nausea or vomiting Anesthetic complications: no   No notable events documented.  Last Vitals:  Vitals:   11/07/21 0444 11/07/21 0745  BP: 101/72 108/77  Pulse: 80 72  Resp: 20 16  Temp: 36.9 C 36.4 C  SpO2: 94% 92%    Last Pain:  Vitals:   11/07/21 0444  TempSrc: Oral  PainSc:                  Emaree Chiu S

## 2021-11-09 ENCOUNTER — Encounter (HOSPITAL_COMMUNITY): Payer: Self-pay | Admitting: Orthopedic Surgery

## 2021-11-18 NOTE — Discharge Summary (Signed)
Patient ID: AVERY KLINGBEIL MRN: [id number] DOB/AGE: [birth year] 76 y.o.  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 male who was admitted 11/06/2021 for operative treatment of Compression fracture of body of thoracic vertebra ([place]). Patient has severe unremitting pain that affects sleep, daily activities, and work/hobbies. After pre-op clearance the patient was taken to the operating room on 11/06/2021 and underwent  Procedure(s): THORACIC EIGHT, [place].    Patient was given perioperative antibiotics:  Anti-infectives (From admission, onward)    Start     Dose/Rate Route Frequency Ordered Stop   11/07/21 0600  ceFAZolin (ANCEF) IVPB 2g/100 mL premix        2 g 200 mL/hr over 30 Minutes Intravenous On call to O.R. 11/06/21 1218 11/06/21 1646   11/06/21 1225  ceFAZolin (ANCEF) 2-4 GM/100ML-% IVPB       Note to Pharmacy: Tamsen Snider M: cabinet override      11/06/21 1225 11/06/21 1645        Patient was given sequential compression devices, early ambulation to prevent DVT.  Patient benefited maximally from hospital stay and there were no complications.    Recent vital signs: BP 108/77 (BP Location: Right Arm)    Pulse 72    Temp 97.6 F (36.4 C)    Resp 16    Ht 5\' 8"  (1.727 m)    Wt 97.5 kg    SpO2 92%    BMI 32.69 kg/m    Discharge  Medications:   Allergies as of 11/07/2021   No Known Allergies      Medication List     TAKE these medications    aspirin EC 81 MG tablet Take 81 mg by mouth daily. Swallow whole.   atorvastatin 40 MG tablet Commonly known as: LIPITOR Take 1 tablet (40 mg total) by mouth daily.   carbidopa-levodopa 25-100 MG tablet Commonly known as: SINEMET IR Take 1 tablet by mouth at bedtime.   gabapentin 600 MG tablet Commonly known as: NEURONTIN Take 600 mg by mouth 2 (two) times daily.   losartan 25 MG tablet Commonly known as: COZAAR Take 25 mg by mouth in the morning.   metFORMIN 500 MG tablet Commonly known as: GLUCOPHAGE Take 500 mg by mouth 2 (two) times daily.   traMADol 50 MG tablet Commonly known as: ULTRAM Take 1 tablet (50 mg total) by mouth every 4 (four) hours as needed. What changed:  when to take this reasons to take this   traZODone 100 MG tablet Commonly known as: DESYREL Take 100 mg by mouth at bedtime.        Diagnostic Studies: DG Thoracic Spine 2 View  Result Date: 11/06/2021 CLINICAL DATA:  T8 and T9 kyphoplasty EXAM: THORACIC SPINE 2 VIEWS COMPARISON:  None. FINDINGS: Two fluoroscopic images are obtained during the  performance of the procedure and are provided for interpretation only. Midthoracic wedge compression deformities and vertebral augmentations identified, presumably T8 and T9. Evaluation limited by field of view. Please refer to operative report. FLUOROSCOPY TIME:  50 seconds IMPRESSION: 1. Intraoperative evaluation as above. Please refer to operative report. Electronically Signed   By: Randa Ngo M.D.   On: 11/06/2021 19:25   DG C-Arm 1-60 Min-No Report  Result Date: 11/06/2021 Fluoroscopy was utilized by the requesting physician.  No radiographic interpretation.   DG C-Arm 1-60 Min-No Report  Result Date: 11/06/2021 Fluoroscopy was utilized by the requesting physician.  No radiographic interpretation.    Disposition: Discharge  disposition: 01-Home or Self Care        POD #1 s/p kyphoplasty, admitted for itching and skin blistering with unclear etiology. This has imporved.    - encourage ambulation - ultram for pain, Robaxin for muscle spasms -Scripts for pain sent to pharmacy electronically  -D/C instructions sheet printed and in chart -D/C today  -F/U in office 2 weeks   Signed: Justice Britain 11/18/2021, 7:51 PM

## 2021-11-20 ENCOUNTER — Other Ambulatory Visit: Payer: Self-pay

## 2021-11-20 DIAGNOSIS — E785 Hyperlipidemia, unspecified: Secondary | ICD-10-CM

## 2021-11-20 LAB — LIPID PANEL
Chol/HDL Ratio: 2.3 ratio (ref 0.0–5.0)
Cholesterol, Total: 104 mg/dL (ref 100–199)
HDL: 45 mg/dL (ref >39)
LDL Chol Calc (NIH): 42 mg/dL (ref 0–99)
Triglycerides: 83 mg/dL (ref 0–149)
VLDL Cholesterol Cal: 17 mg/dL (ref 5–40)

## 2021-11-20 LAB — HEPATIC FUNCTION PANEL
ALT: 39 IU/L (ref 0–44)
AST: 25 IU/L (ref 0–40)
Albumin: 4.3 g/dL (ref 3.7–4.7)
Alkaline Phosphatase: 74 IU/L (ref 44–121)
Bilirubin Total: 0.9 mg/dL (ref 0.0–1.2)
Bilirubin, Direct: 0.27 mg/dL (ref 0.00–0.40)
Total Protein: 6.4 g/dL (ref 6.0–8.5)

## 2022-01-27 ENCOUNTER — Other Ambulatory Visit: Payer: Self-pay

## 2022-01-27 ENCOUNTER — Encounter: Payer: Self-pay | Admitting: Internal Medicine

## 2022-01-27 ENCOUNTER — Ambulatory Visit: Payer: Medicare HMO | Admitting: Internal Medicine

## 2022-01-27 VITALS — BP 105/64 | HR 57 | Ht 68.0 in | Wt 205.8 lb

## 2022-01-27 DIAGNOSIS — I288 Other diseases of pulmonary vessels: Secondary | ICD-10-CM

## 2022-01-27 DIAGNOSIS — I491 Atrial premature depolarization: Secondary | ICD-10-CM | POA: Diagnosis not present

## 2022-01-27 DIAGNOSIS — I2510 Atherosclerotic heart disease of native coronary artery without angina pectoris: Secondary | ICD-10-CM | POA: Diagnosis not present

## 2022-01-27 DIAGNOSIS — I3481 Nonrheumatic mitral (valve) annulus calcification: Secondary | ICD-10-CM

## 2022-01-27 NOTE — Progress Notes (Signed)
? ?OFFICE CONSULT NOTE ? ?Chief Complaint:  ?Follow-up ? ?Primary Care Physician: ?Patient, No Pcp Per (Inactive) ? ?HPI:  ?Max Ryan is a 76 y.o. male who is being seen today for the evaluation of retinal artery occlusion at the request of Shirleen Schirmer, [place].  This is a pleasant 76 year old male kindly referred for evaluation and management of retinal artery occlusion.  This was noted by Dr. Carolynn Sayers his ophthalmologist on recent eye exam.  He was instructed to start aspirin and continue his other therapies for hypertension, dyslipidemia and diabetes.  Cardiology referral was placed.  Max Ryan primarily is lived in [place] gets his primary care there.  In the past he said he seen a cardiologist possibly for preoperative risk assessment.  He had a stress test remotely.  He has been on low-level statin and had an LDL of 71 back in 2015.  He has not had recent lipid testing.  He denies any prior coronary disease.  He did have a CT scan of the abdomen and pelvis in September 2020, which demonstrated marked diffuse hepatic steatosis and "atheromatous coronary artery calcifications".  He reports heart disease in his father who also had a pacemaker.  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  He denies any palpitations or history of atrial fibrillation.  He has not had prior carotid Dopplers however has had carotid auscultation. ? ?07/23/2021 ? ?Max Ryan returns today for follow-up.  He was seen for retinal artery occlusion work-up.  He did undergo vascular Dopplers of the carotids in July which showed minimal bilateral carotid thickening.  He was advised by me to start low-dose aspirin.  He was supposed to get a lipid profile however that was not drawn.  He said he recently had lipids through a primary care provider called [facility] a few weeks ago.  We will see if we can get those records.  He has been complaining of shortness of breath which has been progressive over the past  several weeks.  He says he has a remote history of pulmonary sarcoidosis but has not been on treatment for that.  Again CT in the past showed atheromatous coronary artery calcifications and diffuse hepatic steatosis.  He denies any anginal pain. ? ?01/27/2022 ? ?Max Ryan is seen today in follow-up.  Overall he says he is feeling well.  He lost more than 20 to 25 pounds.  He was able to decrease the losartan back to 25 mg daily.  He remains on aspirin and atorvastatin.  Lipids are well controlled in January total cholesterol 104, HDL 45 LDL 42 and triglycerides 83.  He underwent CT coronary angiography back in the fall which showed a very heavy coronary calcification with a CAC score 1531, 88th percentile for age and sex matched controls.  No obstructive coronary disease was suspected.  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  An old Lap-Band is still noted to be present.  He also has diffuse sarcoidosis but says he is asymptomatic from this. ? ?PMHx:  ?Past Medical History:  ?Diagnosis Date  ? Cancer [facility])   ? skin cancer  ? Coronary artery disease   ? Diabetes mellitus without complication ([place])   ? History of kidney stones   ? Hypertension   ? Neuropathy   ? bilateral feet  ? Sarcoidosis   ? Stroke [facility])   ?  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 ? ?Past Surgical History:  ?Procedure Laterality Date  ? ANTERIOR CERVICAL DECOMPRESSION/DISCECTOMY FUSION 4 LEVELS N/A 08/17/2019  ? Procedure: ANTERIOR CERVICAL DECOMPRESSION FUSION CERVICAL THREE-FOUR, CERVICAL FOUR-FIVE, CERVICAL FIVE-SIX, CERVICAL SIX-SEVEN WITH INSTRUMENTATION AND ALLOGRAFT;  Surgeon: Phylliss Bob, MD;  Location: [place];  Service: Orthopedics;  Laterality: N/A;  ? CARPAL TUNNEL RELEASE Right   ? CHOLECYSTECTOMY    ? COLONOSCOPY    ? CYSTOSCOPY/URETEROSCOPY/HOLMIUM LASER/STENT PLACEMENT Right 07/19/2019  ? Procedure:  CYSTOSCOPY/URETEROSCOPY/HOLMIUM LASER/STENT PLACEMENT;  Surgeon: Ardis Hughs, MD;  Location: [facility];  Service: Urology;  Laterality: Right;  ? HERNIA REPAIR    ? JOINT REPLACEMENT Left   ? knee  ? KNEE SURGERY Left   ? KYPHOPLASTY N/A 11/06/2021  ? Procedure: THORACIC EIGHT, THORACIC NINE [place];  Surgeon: Phylliss Bob, MD;  Location: [place];  Service: Orthopedics;  Laterality: N/A;  ? LAPAROSCOPIC ROUX-EN-Y GASTRIC BYPASS WITH UPPER ENDOSCOPY AND REMOVAL OF LAP BAND    ? POSTERIOR CERVICAL FUSION/FORAMINOTOMY N/A 04/26/2020  ? Procedure: POSTERIOR CERVICAL DECOMPRESSION CERVICAL SIX- THORACIC ONE;  Surgeon: Phylliss Bob, MD;  Location: [place];  Service: Orthopedics;  Laterality: N/A;  ? SPINAL FUSION  2004  ? TONSILLECTOMY    ? ? ?FAMHx:  ?Family History  ?Problem Relation Age of Onset  ? CAD Father   ? ? ?SOCHx:  ? reports that he quit smoking about 18 years ago. His smoking use included cigarettes. He has never used smokeless tobacco. He reports that he does not currently use alcohol after a past usage of about 42.0 standard drinks per week. He reports current drug use. Drug: Marijuana. ? ?ALLERGIES:  ?No Known Allergies ? ?ROS: ?Pertinent items noted in HPI and remainder of comprehensive ROS otherwise negative. ? ?HOME MEDS: ?Current Outpatient Medications on File Prior to Visit  ?Medication Sig Dispense Refill  ? aspirin EC 81 MG tablet Take 81 mg by mouth daily. Swallow whole.    ? atorvastatin (LIPITOR) 40 MG tablet Take 1 tablet (40 mg total) by mouth daily. 90 tablet 3  ? carbidopa-levodopa (SINEMET IR) 25-100 MG tablet Take 1 tablet by mouth at bedtime.    ? gabapentin (NEURONTIN) 600 MG tablet Take 600 mg by mouth 2 (two) times daily.    ? losartan (COZAAR) 25 MG tablet Take 25 mg by mouth in the morning.    ? metFORMIN (GLUCOPHAGE) 500 MG tablet Take 500 mg by mouth 2 (two) times daily.    ? traMADol (ULTRAM) 50 MG tablet Take 1 tablet (50 mg total) by mouth every 4 (four) hours as  needed. 30 tablet 2  ? traZODone (DESYREL) 100 MG tablet Take 100 mg by mouth at bedtime.    ? ?No current facility-administered medications on file prior to visit.  ? ? ?LABS/IMAGING: ?No results found for this or any previous visit (from the past 48 hour(s)). ?No results found. ? ?LIPID PANEL: ?   ?Component Value Date/Time  ? CHOL 104 11/20/2021 0923  ? TRIG 83 11/20/2021 0923  ? HDL 45 11/20/2021 0923  ? CHOLHDL 2.3 11/20/2021 0923  ? [place] 42 11/20/2021 0923  ? ? ?WEIGHTS: ?Wt Readings from Last 3 Encounters:  ?01/27/22 205 lb 12.8 oz (93.4 kg)  ?11/06/21 215 lb (97.5 kg)  ?10/29/21 217 lb (98.4 kg)  ? ? ?VITALS: ?BP 105/64   Pulse (!) 57   Ht '5\' 8"'$  (1.727 m)   Wt 205 lb 12.8 oz (93.4 kg)   SpO2  96%   BMI 31.29 kg/m?  ? ?EXAM: ?General appearance: alert and no distress ?Neck: no carotid bruit, no JVD, and thyroid not enlarged, symmetric, no tenderness/mass/nodules ?Lungs: clear to auscultation bilaterally ?Heart: regular rate and rhythm, S1, S2 normal, no murmur, click, rub or gallop ?Abdomen: soft, non-tender; bowel sounds normal; no masses,  no organomegaly ?Extremities: extremities normal, atraumatic, no cyanosis or edema ?Pulses: 2+ and symmetric ?Skin: Skin color, texture, turgor normal. No rashes or lesions ?Neurologic: Grossly normal ?Psych: Pleasant ? ?EKG: ?Sinus bradycardia 57-personally reviewed ? ?ASSESSMENT: ?Progressive dyspnea on exertion ?Branch retinal artery occlusion ?CT findings of coronary calcification/atherosclerosis ?Severe hepatic steatosis ?Type 2 diabetes ?Hypertension ?Morbid obesity ?Pulmonary sarcoidosis ? ?PLAN: ?1.   Max Ryan seems to be doing well.  He is noted to have a PAC not a PVC on EKG.  The computer in fact said supraventricular beat which is PAC.  Anyhow this is not of any significant concern.  He seems to be well managed on his current medicines.  His blood pressure is well controlled.  His cholesterol is very low.  He is asymptomatic.  With sarcoid he denies  any shortness of breath.  He has no chest pain.  We will continue our current therapies.  Plan to repeat echo in a year to reassess his dilated aorta and mitral apparatus ? ?Pixie Casino, MD, [facility], FACP  ?[place]

## 2022-01-27 NOTE — Patient Instructions (Signed)
Medication Instructions:  ?Your physician recommends that you continue on your current medications as directed. Please refer to the Current Medication list given to you today. ? ?*If you need a refill on your cardiac medications before your next appointment, please call your pharmacy* ? ?Testing/Procedures: ?Your physician has requested that you have an echocardiogram. Echocardiography is a painless test that uses sound waves to create images of your heart. It provides your doctor with information about the size and shape of your heart and how well your heart?s chambers and valves are working. This procedure takes approximately one hour. There are no restrictions for this procedure. ?-- due March 2024 ? ? ?Follow-Up: ?At [facility], you and your health needs are our priority.  As part of our continuing mission to provide you with exceptional heart care, we have created designated Provider Care Teams.  These Care Teams include your primary Cardiologist (physician) and Advanced Practice Providers (APPs -  Physician Assistants and Nurse Practitioners) who all work together to provide you with the care you need, when you need it. ? ?We recommend signing up for the patient portal called "MyChart".  Sign up information is provided on this After Visit Summary.  MyChart is used to connect with patients for Virtual Visits (Telemedicine).  Patients are able to view lab/test results, encounter notes, upcoming appointments, etc.  Non-urgent messages can be sent to your provider as well.   ?To learn more about what you can do with MyChart, go to [email or web address].   ? ?Your next appointment:   ? ?1 year with Dr. Debara Pickett -- after echo ?

## 2022-01-28 ENCOUNTER — Encounter: Payer: Self-pay | Admitting: Nurse Practitioner

## 2022-01-28 ENCOUNTER — Ambulatory Visit (INDEPENDENT_AMBULATORY_CARE_PROVIDER_SITE_OTHER): Payer: Medicare HMO | Admitting: Nurse Practitioner

## 2022-01-28 DIAGNOSIS — I10 Essential (primary) hypertension: Secondary | ICD-10-CM

## 2022-01-28 NOTE — Progress Notes (Signed)
$'@Patient'R$  ID: Max Ryan, male    DOB: May 19, 1946, 76 y.o.   MRN: [id number] ? ?Chief Complaint  ?Patient presents with  ? Establish Care  ? ? ?Referring provider: ?No ref. provider found ? ? ?HPI ? ?Patient presents today to establish care.  Patient states that he has a history of hypertension, diabetes, high cholesterol, neuropathy.  Patient is followed by cardiology for arterial disease.  Overall patient states that he has been doing well.  He has no new issues or concerns today. Denies f/c/s, n/v/d, hemoptysis, PND, chest pain or edema. ? ? ? ? ? ?No Known Allergies ? ?Immunization History  ?Administered Date(s) Administered  ? PFIZER(Purple Top)SARS-COV-2 Vaccination 12/08/2019, 12/29/2019  ? ? ?Past Medical History:  ?Diagnosis Date  ? Cancer [facility])   ? skin cancer  ? Coronary artery disease   ? Diabetes mellitus without complication ([place])   ? History of kidney stones   ? Hypertension   ? Neuropathy   ? bilateral feet  ? Sarcoidosis   ? Stroke [facility])   ? 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 ? ?Tobacco History: ?Social History  ? ?Tobacco Use  ?Smoking Status Former  ? Types: Cigarettes  ? Quit date: 2005  ? Years since quitting: 18.2  ?Smokeless Tobacco Never  ? ?Counseling given: Not Answered ? ? ?Outpatient Encounter Medications as of 01/28/2022  ?Medication Sig  ? aspirin EC 81 MG tablet Take 81 mg by mouth daily. Swallow whole.  ? carbidopa-levodopa (SINEMET IR) 25-100 MG tablet Take 1 tablet by mouth at bedtime.  ? gabapentin (NEURONTIN) 600 MG tablet Take 600 mg by mouth 2 (two) times daily.  ? losartan (COZAAR) 25 MG tablet Take 25 mg by mouth in the morning.  ? metFORMIN (GLUCOPHAGE) 500 MG tablet Take 500 mg by mouth 2 (two) times daily.  ? traMADol (ULTRAM) 50 MG tablet Take 1 tablet (50 mg total) by mouth every 4 (four) hours as needed.  ? traZODone (DESYREL) 100 MG tablet Take 100 mg by mouth at bedtime.  ? atorvastatin (LIPITOR) 40 MG tablet Take 1 tablet (40 mg total) by mouth daily.  ? ?No  facility-administered encounter medications on file as of 01/28/2022.  ? ? ? ?Review of Systems ? ?Review of Systems  ?Constitutional: Negative.   ?HENT: Negative.    ?Cardiovascular: Negative.   ?Gastrointestinal: Negative.   ?Allergic/Immunologic: Negative.   ?Neurological: Negative.   ?Psychiatric/Behavioral: Negative.     ? ? ? ?Physical Exam ? ?BP 118/68   Pulse (!) 58   Resp 18   Ht '5\' 8"'$  (1.727 m)   Wt 204 lb 6 oz (92.7 kg)   SpO2 96%   BMI 31.08 kg/m?  ? ?Wt Readings from Last 5 Encounters:  ?01/28/22 204 lb 6 oz (92.7 kg)  ?01/27/22 205 lb 12.8 oz (93.4 kg)  ?11/06/21 215 lb (97.5 kg)  ?10/29/21 217 lb (98.4 kg)  ?09/20/21 236 lb 3.2 oz (107.1 kg)  ? ? ? ?Physical Exam ?Vitals and nursing note reviewed.  ?Constitutional:   ?   General: He is not in acute distress. ?   Appearance: He is well-developed.  ?Cardiovascular:  ?   Rate and Rhythm: Normal rate and regular rhythm.  ?Pulmonary:  ?   Effort: Pulmonary effort is normal.  ?   Breath sounds: Normal breath sounds.  ?Skin: ?   General: Skin is warm and dry.  ?Neurological:  ?   Mental Status: He is alert and oriented to person,  place, and time.  ? ? ? ?Lab Results: ? ?CBC ?   ?Component Value Date/Time  ? WBC 7.2 10/29/2021 0934  ? RBC 4.53 10/29/2021 0934  ? HGB 14.6 10/29/2021 0934  ? HCT 43.4 10/29/2021 0934  ? PLT 236 10/29/2021 0934  ? MCV 95.8 10/29/2021 0934  ? MCH 32.2 10/29/2021 0934  ? MCHC 33.6 10/29/2021 0934  ? RDW 13.0 10/29/2021 0934  ? LYMPHSABS 1.3 10/29/2021 0934  ? MONOABS 0.6 10/29/2021 0934  ? EOSABS 0.3 10/29/2021 0934  ? BASOSABS 0.0 10/29/2021 0934  ? ? ?BMET ?   ?Component Value Date/Time  ? NA 137 10/29/2021 0934  ? NA 142 08/02/2021 1050  ? K 4.3 10/29/2021 0934  ? CL 101 10/29/2021 0934  ? CO2 29 10/29/2021 0934  ? GLUCOSE 112 (H) 10/29/2021 0934  ? BUN 14 10/29/2021 0934  ? BUN 16 08/02/2021 1050  ? CREATININE 0.98 10/29/2021 0934  ? CALCIUM 9.5 10/29/2021 0934  ? GFRNONAA >60 10/29/2021 0934  ? GFRAA >60 04/19/2020 1042   ? ? ?BNP ?No results found for: BNP ? ?ProBNP ?No results found for: PROBNP ? ?Imaging: ?No results found. ? ? ?Assessment & Plan:  ? ?Essential hypertension ?Continue current medications ? ?Current Outpatient Medications on File Prior to Visit  ?Medication Sig Dispense Refill  ? aspirin EC 81 MG tablet Take 81 mg by mouth daily. Swallow whole.    ? carbidopa-levodopa (SINEMET IR) 25-100 MG tablet Take 1 tablet by mouth at bedtime.    ? gabapentin (NEURONTIN) 600 MG tablet Take 600 mg by mouth 2 (two) times daily.    ? losartan (COZAAR) 25 MG tablet Take 25 mg by mouth in the morning.    ? metFORMIN (GLUCOPHAGE) 500 MG tablet Take 500 mg by mouth 2 (two) times daily.    ? traMADol (ULTRAM) 50 MG tablet Take 1 tablet (50 mg total) by mouth every 4 (four) hours as needed. 30 tablet 2  ? traZODone (DESYREL) 100 MG tablet Take 100 mg by mouth at bedtime.    ? atorvastatin (LIPITOR) 40 MG tablet Take 1 tablet (40 mg total) by mouth daily. 90 tablet 3  ? ?No current facility-administered medications on file prior to visit.  ? ?Heart healthy diet ?Stay active ? ?Follow up: ? ?Follow up in 4-6 weeks with Dr. Redmond Pulling or Amy - hypertension / diabetes ? ? ? ? ?Fenton Foy, NP ?01/28/2022 ? ?

## 2022-01-28 NOTE — Patient Instructions (Signed)
Encounter to establish care: ? ?Continue current medications ? ?Current Outpatient Medications on File Prior to Visit  ?Medication Sig Dispense Refill  ? aspirin EC 81 MG tablet Take 81 mg by mouth daily. Swallow whole.    ? carbidopa-levodopa (SINEMET IR) 25-100 MG tablet Take 1 tablet by mouth at bedtime.    ? gabapentin (NEURONTIN) 600 MG tablet Take 600 mg by mouth 2 (two) times daily.    ? losartan (COZAAR) 25 MG tablet Take 25 mg by mouth in the morning.    ? metFORMIN (GLUCOPHAGE) 500 MG tablet Take 500 mg by mouth 2 (two) times daily.    ? traMADol (ULTRAM) 50 MG tablet Take 1 tablet (50 mg total) by mouth every 4 (four) hours as needed. 30 tablet 2  ? traZODone (DESYREL) 100 MG tablet Take 100 mg by mouth at bedtime.    ? atorvastatin (LIPITOR) 40 MG tablet Take 1 tablet (40 mg total) by mouth daily. 90 tablet 3  ? ?No current facility-administered medications on file prior to visit.  ? ?Heart healthy diet ?Stay active ? ?Follow up: ? ?Follow up in 4-6 weeks with Dr. Redmond Pulling or Amy - hypertension / diabetes ?

## 2022-01-28 NOTE — Assessment & Plan Note (Signed)
Continue current medications ? ?Current Outpatient Medications on File Prior to Visit  ?Medication Sig Dispense Refill  ?? aspirin EC 81 MG tablet Take 81 mg by mouth daily. Swallow whole.    ?? carbidopa-levodopa (SINEMET IR) 25-100 MG tablet Take 1 tablet by mouth at bedtime.    ?? gabapentin (NEURONTIN) 600 MG tablet Take 600 mg by mouth 2 (two) times daily.    ?? losartan (COZAAR) 25 MG tablet Take 25 mg by mouth in the morning.    ?? metFORMIN (GLUCOPHAGE) 500 MG tablet Take 500 mg by mouth 2 (two) times daily.    ?? traMADol (ULTRAM) 50 MG tablet Take 1 tablet (50 mg total) by mouth every 4 (four) hours as needed. 30 tablet 2  ?? traZODone (DESYREL) 100 MG tablet Take 100 mg by mouth at bedtime.    ?? atorvastatin (LIPITOR) 40 MG tablet Take 1 tablet (40 mg total) by mouth daily. 90 tablet 3  ? ?No current facility-administered medications on file prior to visit.  ? ?Heart healthy diet ?Stay active ? ?Follow up: ? ?Follow up in 4-6 weeks with Dr. Redmond Pulling or Amy - hypertension / diabetes ?

## 2022-02-25 ENCOUNTER — Encounter: Payer: Self-pay | Admitting: Pulmonary Disease

## 2022-02-25 ENCOUNTER — Ambulatory Visit: Payer: Medicare HMO | Admitting: Pulmonary Disease

## 2022-02-25 VITALS — BP 124/76 | HR 65 | Temp 98.1°F | Ht 68.0 in | Wt 211.0 lb

## 2022-02-25 DIAGNOSIS — D869 Sarcoidosis, unspecified: Secondary | ICD-10-CM | POA: Diagnosis not present

## 2022-02-25 DIAGNOSIS — R0609 Other forms of dyspnea: Secondary | ICD-10-CM

## 2022-02-25 LAB — SEDIMENTATION RATE: Sed Rate: 4 mm/hr (ref 0–20)

## 2022-02-25 LAB — C-REACTIVE PROTEIN: CRP: <1 mg/dL (ref 0.5–20.0)

## 2022-02-25 NOTE — Progress Notes (Signed)
? ?      Max Ryan    [id number]    September 13, 1946 ? ?Primary Care Physician:Wilson, Amy A, NP ? ?Referring Physician: No referring provider defined for this encounter. ? ?Chief complaint:   ?Patient being evaluated for shortness of breath on exertion ?History of sarcoidosis diagnosed in 2007 ? ?HPI: ? ?Sarcoidosis was diagnosed when he was living in [place] was told at the time that in a good number of patients, it burned itself out ?He did not receive any treatment for it back then and has not had any problems until recently when he started getting concerned because of shortness of breath ? ?He is able to stay active ? ?He does ride a bike for about an hour ? ?Shortness of breath with exertion sometimes with walking sometimes walking up a grade ? ?He did smoke in the past, quit in 2007, about a pack a day ? 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 ?History of hypertension, diabetes, hypercholesterolemia ?Past history of a stroke ? ?Did see an ophthalmologist recently ? ?He does not recollect having a biopsy specifically for sarcoidosis but may have had a biopsy when his diagnosis was made ? ?Does have a pet dog and cat,-being around the animals do not affect his breathing ? ?He does have chronic back pain and this sometimes affects his activity level ?Outpatient Encounter Medications as of 02/25/2022  ?Medication Sig  ? aspirin EC 81 MG tablet Take 81 mg by mouth daily. Swallow whole.  ? carbidopa-levodopa (SINEMET IR) 25-100 MG tablet Take 1 tablet by mouth at bedtime.  ? gabapentin (NEURONTIN) 600 MG tablet Take 600 mg by mouth 2 (two) times daily.  ? losartan (COZAAR) 25 MG tablet Take 25 mg by mouth in the morning.  ? metFORMIN (GLUCOPHAGE) 500 MG tablet Take 500 mg by mouth 2 (two) times daily.  ? traMADol (ULTRAM) 50 MG tablet Take 1 tablet (50 mg total) by mouth every 4 (four) hours as needed.  ? traZODone (DESYREL) 100 MG tablet Take 100 mg by mouth at bedtime.  ? atorvastatin (LIPITOR) 40  MG tablet Take 1 tablet (40 mg total) by mouth daily.  ? ?No facility-administered encounter medications on file as of 02/25/2022.  ? ? ?Allergies as of 02/25/2022  ? (No Known Allergies)  ? ? ?Past Medical History:  ?Diagnosis Date  ? Cancer [facility])   ? skin cancer  ? Coronary artery disease   ? Diabetes mellitus without complication ([place])   ? History of kidney stones   ? Hypertension   ? Neuropathy   ? bilateral feet  ? Sarcoidosis   ? Stroke [facility])   ? 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 ? ?Past Surgical History:  ?Procedure Laterality Date  ? ANTERIOR CERVICAL DECOMPRESSION/DISCECTOMY FUSION 4 LEVELS N/A 08/17/2019  ? Procedure: ANTERIOR CERVICAL DECOMPRESSION FUSION CERVICAL THREE-FOUR, CERVICAL FOUR-FIVE, CERVICAL FIVE-SIX, CERVICAL SIX-SEVEN WITH INSTRUMENTATION AND ALLOGRAFT;  Surgeon: Phylliss Bob, MD;  Location: [place];  Service: Orthopedics;  Laterality: N/A;  ? CARPAL TUNNEL RELEASE Right   ? CHOLECYSTECTOMY    ? COLONOSCOPY    ? CYSTOSCOPY/URETEROSCOPY/HOLMIUM LASER/STENT PLACEMENT Right 07/19/2019  ? Procedure: CYSTOSCOPY/URETEROSCOPY/HOLMIUM LASER/STENT PLACEMENT;  Surgeon: Ardis Hughs, MD;  [facility];  Service: Urology;  Laterality: Right;  ? HERNIA REPAIR    ? JOINT REPLACEMENT Left   ? knee  ? KNEE SURGERY Left   ? KYPHOPLASTY N/A 11/06/2021  ? Procedure: THORACIC EIGHT, THORACIC NINE [place];  Surgeon:  Phylliss Bob, MD;  Location: [place];  Service: Orthopedics;  Laterality: N/A;  ? LAPAROSCOPIC ROUX-EN-Y GASTRIC BYPASS WITH UPPER ENDOSCOPY AND REMOVAL OF LAP BAND    ? POSTERIOR CERVICAL FUSION/FORAMINOTOMY N/A 04/26/2020  ? Procedure: POSTERIOR CERVICAL DECOMPRESSION CERVICAL SIX- THORACIC ONE;  Surgeon: Phylliss Bob, MD;  Location: [place];  Service: Orthopedics;  Laterality: N/A;  ? SPINAL FUSION  2004  ? TONSILLECTOMY    ? ? ?Family History  ?Problem Relation Age of Onset  ? CAD Father   ? ? ?Social History  ? ?Socioeconomic History  ? Marital status: Married  ?  Spouse name:  Not on file  ? Number of children: Not on file  ? Years of education: Not on file  ? Highest education level: Not on file  ?Occupational History  ? Not on file  ?Tobacco Use  ? Smoking status: Former  ?  Types: Cigarettes, E-cigarettes  ?  Quit date: 2005  ?  Years since quitting: 18.3  ? Smokeless tobacco: Never  ?Vaping Use  ? Vaping Use: Never used  ?Substance and Sexual Activity  ? Alcohol use: Not Currently  ?  Alcohol/week: 42.0 standard drinks  ?  Types: 42 Glasses of wine per week  ?  Comment: "Haven't had a drink since 9/1 2022" - per pt  ? Drug use: Yes  ?  Types: Marijuana  ?  Comment: occ  ? Sexual activity: Not on file  ?Other Topics Concern  ? Not on file  ?Social History Narrative  ? Not on file  ? ?Social Determinants of Health  ? ?Financial Resource Strain: Not on file  ?Food Insecurity: Not on file  ?Transportation Needs: Not on file  ?Physical Activity: Not on file  ?Stress: Not on file  ?Social Connections: Not on file  ?Intimate Partner Violence: Not on file  ? ? ?Review of Systems  ?Constitutional:  Negative for fatigue.  ?Respiratory:  Positive for shortness of breath. Negative for cough.   ? ?Vitals:  ? 02/25/22 1027  ?BP: 124/76  ?Pulse: 65  ?Temp: 98.1 ?F (36.7 ?C)  ?SpO2: 97%  ? ? ? ?Physical Exam ?Constitutional:   ?   Appearance: He is obese.  ?HENT:  ?   Head: Normocephalic.  ?   Mouth/Throat:  ?   Mouth: Mucous membranes are moist.  ?   Pharynx: No oropharyngeal exudate or posterior oropharyngeal erythema.  ?Cardiovascular:  ?   Rate and Rhythm: Normal rate and regular rhythm.  ?   Heart sounds: No murmur heard. ?  No friction rub.  ?Pulmonary:  ?   Effort: No respiratory distress.  ?   Breath sounds: No stridor. No wheezing or rhonchi.  ?Musculoskeletal:  ?   Cervical back: No rigidity or tenderness.  ?Neurological:  ?   Mental Status: He is alert.  ?Psychiatric:     ?   Mood and Affect: Mood normal.  ? ? ? ?Data Reviewed: ?Cardiac CT was reviewed with the patient-the CT does show  calcified hilar and mediastinal nodes-no significant evidence of scarring in the lower lobes of the lungs ? ?Assessment:  ?History of sarcoidosis ? ?Shortness of breath on exertion ? ?Significant history of back pain ? ?No history of cardiac arrhythmia, no skin lesions, no chest pressure or chest discomfort, no other symptoms of arthritis in other joints, no significant history of eye disease ? ? ? ?Plan/Recommendations: ?We will obtain a CT scan of the chest with contrast to assess upper lobes of the  lungs ?-Sarcoidosis related fibrosis usually occurs in the upper lobes ? ?Obtain a pulmonary function test ? ?Obtain CRP, ESR, ACE level ? ?Most recent blood work shows normal BUN/creatinine, normal calcium ? ?It is more likely the patient has burned-out secondary to cyst and active sarcoidosis at present ? ?I will see him back in about 3 months ? ?We did discuss treatment options for sarcoidosis ? ?Sherrilyn Rist MD ?[facility] ?02/25/2022, 10:59 AM ? ?CC: No ref. provider found ? ? ? ?

## 2022-02-25 NOTE — Patient Instructions (Signed)
History of sarcoidosis diagnosed in 2007 ? ?We will get a CT scan of the chest with contrast to assess for scarring in the lungs, focusing on the top part of the lungs-this is usually where scarring from sarcoidosis occurs ? ?We will get a breathing study-can be done on the day of next visit ? ?Blood work including CRP, ESR, ACE level ? ?I will see you in about 3 months ? ?Continue graded exercises ? ?Call with significant concerns ? ? ?

## 2022-02-26 ENCOUNTER — Ambulatory Visit: Payer: Medicare HMO | Admitting: Family

## 2022-02-26 LAB — ANGIOTENSIN CONVERTING ENZYME: Angiotensin-Converting Enzyme: 14 U/L (ref 9–67)

## 2022-03-06 ENCOUNTER — Other Ambulatory Visit: Payer: Self-pay | Admitting: Family Medicine

## 2022-03-06 NOTE — Telephone Encounter (Signed)
Medication Refill - Medication: gabapentin (NEURONTIN) 600 MG tablet ? ?Has the patient contacted their pharmacy? No. ? ?(Agent: If no, request that the patient contact the pharmacy for the refill. If patient does not wish to contact the pharmacy document the reason why and proceed with request.) Contact PCP because we do not have this medication on file ? ? ? ?Preferred Pharmacy (with phone number or street name):  ?[id number] [place] Phone:  [phone] ?Fax:  [phone] ?  ? ? ?Has the patient been seen for an appointment in the last year OR does the patient have an upcoming appointment? Yes.   ? ?Agent: Please be advised that RX refills may take up to 3 business days. We ask that you follow-up with your pharmacy. ?

## 2022-03-07 NOTE — Telephone Encounter (Signed)
Requested medication (s) are due for refill today:   Provider to review ? ?Requested medication (s) are on the active medication list:   Yes as a historical med ? ?Future visit scheduled:   Yes in 1 wk with Amy ? ? ?Last ordered: Historical med from 2 yrs ago. ? ?Returned because not on his current med list.     ? ?Requested Prescriptions  ?Pending Prescriptions Disp Refills  ? gabapentin (NEURONTIN) 600 MG tablet    ?  Sig: Take 1 tablet (600 mg total) by mouth 2 (two) times daily.  ?  ? Neurology: Anticonvulsants - gabapentin Passed - 03/06/2022 11:05 AM  ?  ?  Passed - Cr in normal range and within 360 days  ?  Creatinine, Ser  ?Date Value Ref Range Status  ?10/29/2021 0.98 0.61 - 1.24 mg/dL Final  ?  ?  ?  ?  Passed - Completed PHQ-2 or PHQ-9 in the last 360 days  ?  ?  Passed - Valid encounter within last 12 months  ?  Recent Outpatient Visits   ? ?      ? 1 month ago Essential hypertension  ? [facility] at [facility], Kriste Basque, NP  ? ?  ?  ?Future Appointments   ? ?        ? In 1 week Camillia Herter, NP Primary Care at [facility]  ? In 2 months Laurin Coder, MD [facility]  ? ?  ? ? ?  ?  ?  ? ?

## 2022-03-08 NOTE — Progress Notes (Addendum)
Patient ID: Max Ryan, male    DOB: [birth year]  MRN: [id number]  CC: Hypertension Follow-Up  Subjective: Max Ryan is a 76 y.o. male who presents for hypertension follow-up.   His concerns today include:   Last appointment 01/28/2022 with Angus Seller, NP.  Tramadol for back pain managed by Estill Bamberg, MD.  Hypertension: Currently taking: see medication list  Med Adherence: [x]  Yes    []  No Medication side effects: []  Yes    [x]  No Adherence with salt restriction (low-salt diet): []  Yes    [x]  No Exercise: Yes []  No [x]  Home Monitoring?: []  Yes    [x]  No Monitoring Frequency: []  Yes    [x]  No Home BP results range: []  Yes    [x]  No SOB? []  Yes    [x]  No Chest Pain?: []  Yes    [x]  No Comments: Reports Cardiology managing coronary artery disease.   2. Diabetes type 2:  Med Adherence:  [x]  Yes    []  No Medication side effects:  []  Yes    [x]  No Home Monitoring?  []  Yes    [x]  No Diet Adherence: [x]  Yes    []  No Exercise: []  Yes    [x]  No  3. Skin concern: Concern for several spots mostly on bilateral arms. Recently becoming larger and color darkening. Denies additional symptoms. Requesting referral to Dermatology.    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 Swallow whole.     atorvastatin (LIPITOR) 40 MG tablet Take 1 tablet (40 mg total) by mouth daily. 90 tablet 3   carbidopa-levodopa (SINEMET IR) 25-100 MG tablet Take  1 tablet by mouth at bedtime.     gabapentin (NEURONTIN) 600 MG tablet Take 600 mg by mouth 2 (two) times daily.     traMADol (ULTRAM) 50 MG tablet Take 1 tablet (50 mg total) by mouth every 4 (four) hours as needed. 30 tablet 2   traZODone (DESYREL) 100 MG tablet Take 100 mg by mouth at bedtime.     No current facility-administered medications on file prior to visit.    No Known Allergies  Social History   Socioeconomic History   Marital status: Married    Spouse name: Not on file   Number of children: Not on file   Years of education: Not on file   Highest education level: Not on file  Occupational History   Not on file  Tobacco Use   Smoking status: Former    Types: Cigarettes, E-cigarettes    Quit date: 2005    Years since quitting: 18.3   Smokeless tobacco: Never  Vaping Use   Vaping Use: Never used  Substance and Sexual Activity   Alcohol use: Not Currently    Alcohol/week: 42.0 standard drinks    Types: 42 Glasses of wine per week    Comment: "Haven't had a drink since 9/1 2022" - per pt   Drug use: Yes    Types: Marijuana  Comment: occ   Sexual activity: Not on file  Other Topics Concern   Not on file  Social History Narrative   Not on file   Social Determinants of Health   Financial Resource Strain: Not on file  Food Insecurity: Not on file  Transportation Needs: Not on file  Physical Activity: Not on file  Stress: Not on file  Social Connections: Not on file  Intimate Partner Violence: Not on file    Family History  Problem Relation Age of Onset   CAD Father     Past Surgical History:  Procedure Laterality Date   ANTERIOR CERVICAL DECOMPRESSION/DISCECTOMY FUSION 4 LEVELS N/A 08/17/2019   Procedure: ANTERIOR CERVICAL DECOMPRESSION FUSION CERVICAL THREE-FOUR, CERVICAL FOUR-FIVE, CERVICAL FIVE-SIX, CERVICAL SIX-SEVEN WITH INSTRUMENTATION AND ALLOGRAFT;  Surgeon: Estill Bamberg, MD;  [facility];  Service: Orthopedics;  Laterality: N/A;    CARPAL TUNNEL RELEASE Right    CHOLECYSTECTOMY     COLONOSCOPY     CYSTOSCOPY/URETEROSCOPY/HOLMIUM LASER/STENT PLACEMENT Right 07/19/2019   Procedure: CYSTOSCOPY/URETEROSCOPY/HOLMIUM LASER/STENT PLACEMENT;  Surgeon: Crist Fat, MD;  [facility];  Service: Urology;  Laterality: Right;   HERNIA REPAIR     JOINT REPLACEMENT Left    knee   KNEE SURGERY Left    KYPHOPLASTY N/A 11/06/2021   Procedure: THORACIC EIGHT, THORACIC NINE KHYPHOPLASTY;  Surgeon: Estill Bamberg, MD;  Location: [facility];  Service: Orthopedics;  Laterality: N/A;   LAPAROSCOPIC ROUX-EN-Y GASTRIC BYPASS WITH UPPER ENDOSCOPY AND REMOVAL OF LAP BAND     POSTERIOR CERVICAL FUSION/FORAMINOTOMY N/A 04/26/2020   Procedure: POSTERIOR CERVICAL DECOMPRESSION CERVICAL SIX- THORACIC ONE;  Surgeon: Estill Bamberg, MD;  Location[facility];  Service: Orthopedics;  Laterality: N/A;   SPINAL FUSION  2004   TONSILLECTOMY      ROS: Review of Systems Negative except as stated above  PHYSICAL EXAM: BP 123/78 (BP Location: Left Arm, Patient Position: Sitting, Cuff Size: Normal)   Pulse 67   Temp 98.5 F (36.9 C)   Resp 18   Ht 5' 7.99" (1.727 m)   Wt 200 lb (90.7 kg)   SpO2 94%   BMI 30.42 kg/m   Physical Exam HENT:     Head: Normocephalic and atraumatic.  Eyes:     Extraocular Movements: Extraocular movements intact.     Conjunctiva/sclera: Conjunctivae normal.     Pupils: Pupils are equal, round, and reactive to light.  Cardiovascular:     Rate and Rhythm: Normal rate and regular rhythm.     Pulses: Normal pulses.     Heart sounds: Normal heart sounds.  Pulmonary:     Effort: Pulmonary effort is normal.     Breath sounds: Normal breath sounds.  Musculoskeletal:     Cervical back: Normal range of motion and neck supple.  Neurological:     General: No focal deficit present.     Mental Status: He is alert and oriented to person, place, and time.  Psychiatric:        Mood and Affect: Mood normal.         Behavior: Behavior normal.    Results for orders placed or performed in visit on 03/14/22  Basic Metabolic Panel  Result Value Ref Range   Glucose 95 70 - 99 mg/dL   BUN 16 8 - 27 mg/dL   Creatinine, Ser 3.08 (L) 0.76 - 1.27 mg/dL   eGFR 95 >65 HQ/ION/6.29   BUN/Creatinine Ratio 21 10 - 24   Sodium 139 134 - 144 mmol/L  Potassium 4.5 3.5 - 5.2 mmol/L   Chloride 99 96 - 106 mmol/L   CO2 28 20 - 29 mmol/L   Calcium 9.7 8.6 - 10.2 mg/dL  POCT glycosylated hemoglobin (Hb A1C)  Result Value Ref Range   Hemoglobin A1C 6.0 (A) 4.0 - 5.6 %   HbA1c POC (<> result, manual entry)     HbA1c, POC (prediabetic range)     HbA1c, POC (controlled diabetic range)      ASSESSMENT AND PLAN: 1. Essential (primary) hypertension: - Continue Losartan as prescribed.  - Counseled on blood pressure goal of less than 140/90, low-sodium, DASH diet, medication compliance, 150 minutes of moderate intensity exercise per week as tolerated. Discussed medication compliance, adverse effects. - BMP to evaluate kidney function and electrolyte balance. - Follow-up with primary provider in 4 months or sooner if needed. - Basic Metabolic Panel - losartan (COZAAR) 25 MG tablet; Take 1 tablet (25 mg total) by mouth in the morning.  Dispense: 120 tablet; Refill: 0  2. Type 2 diabetes mellitus without complication, without long-term current use of insulin (HCC): - Hemoglobin A1c today at goal at 6.0%, goal < 8%.  - Continue Metformin as prescribed.  - Discussed the importance of healthy eating habits, low-carbohydrate diet, low-sugar diet, regular aerobic exercise (at least 150 minutes a week as tolerated) and medication compliance to achieve or maintain control of diabetes. - Follow-up with primary provider in 4 months or sooner if needed.  - POCT glycosylated hemoglobin (Hb A1C) - metFORMIN (GLUCOPHAGE) 500 MG tablet; Take 1 tablet (500 mg total) by mouth 2 (two) times daily.  Dispense: 240 tablet; Refill: 0  3. Skin  abnormalities: - Referral to Dermatology for further evaluation and management.  - Ambulatory referral to Dermatology  Patient was given the opportunity to ask questions.  Patient verbalized understanding of the plan and was able to repeat key elements of the plan. Patient was given clear instructions to go to Emergency Department or return to medical center if symptoms don't improve, worsen, or new problems develop.The patient verbalized understanding.   Orders Placed This Encounter  Procedures   Basic Metabolic Panel   Ambulatory referral to Dermatology   POCT glycosylated hemoglobin (Hb A1C)     Requested Prescriptions   Signed Prescriptions Disp Refills   losartan (COZAAR) 25 MG tablet 120 tablet 0    Sig: Take 1 tablet (25 mg total) by mouth in the morning.   metFORMIN (GLUCOPHAGE) 500 MG tablet 240 tablet 0    Sig: Take 1 tablet (500 mg total) by mouth 2 (two) times daily.    Return in about 4 months (around 07/15/2022) for Follow-Up or next available HTN, DM .  Rema Fendt, NP

## 2022-03-14 ENCOUNTER — Ambulatory Visit (INDEPENDENT_AMBULATORY_CARE_PROVIDER_SITE_OTHER): Payer: Medicare HMO | Admitting: Family

## 2022-03-14 ENCOUNTER — Encounter: Payer: Self-pay | Admitting: Family

## 2022-03-14 VITALS — BP 123/78 | HR 67 | Temp 98.5°F | Resp 18 | Ht 67.99 in | Wt 200.0 lb

## 2022-03-14 DIAGNOSIS — I10 Essential (primary) hypertension: Secondary | ICD-10-CM

## 2022-03-14 DIAGNOSIS — E119 Type 2 diabetes mellitus without complications: Secondary | ICD-10-CM

## 2022-03-14 DIAGNOSIS — L989 Disorder of the skin and subcutaneous tissue, unspecified: Secondary | ICD-10-CM | POA: Diagnosis not present

## 2022-03-14 LAB — POCT GLYCOSYLATED HEMOGLOBIN (HGB A1C): Hemoglobin A1C: 6 % — AB (ref 4.0–5.6)

## 2022-03-14 MED ORDER — METFORMIN HCL 500 MG PO TABS: 500.0000 mg | ORAL_TABLET | Freq: Two times a day (BID) | ORAL | 0 refills | Status: DC

## 2022-03-14 MED ORDER — LOSARTAN POTASSIUM 25 MG PO TABS: 25.0000 mg | ORAL_TABLET | Freq: Every morning | ORAL | 0 refills | Status: DC

## 2022-03-14 NOTE — Progress Notes (Signed)
Diabetes at goal. Continue Metformin as prescribed. Follow-up in 4 months or sooner if needed.

## 2022-03-14 NOTE — Progress Notes (Signed)
Pt presents for hypertension and diabetes follow-up, needs refill on Tramadol  ?

## 2022-03-15 LAB — BASIC METABOLIC PANEL
BUN/Creatinine Ratio: 21 (ref 10–24)
BUN: 16 mg/dL (ref 8–27)
CO2: 28 mmol/L (ref 20–29)
Calcium: 9.7 mg/dL (ref 8.6–10.2)
Chloride: 99 mmol/L (ref 96–106)
Creatinine, Ser: 0.75 mg/dL — ABNORMAL LOW (ref 0.76–1.27)
Glucose: 95 mg/dL (ref 70–99)
Potassium: 4.5 mmol/L (ref 3.5–5.2)
Sodium: 139 mmol/L (ref 134–144)
eGFR: 95 mL/min/{1.73_m2} (ref >59)

## 2022-03-15 NOTE — Progress Notes (Signed)
Kidney function and electrolytes normal.

## 2022-03-20 ENCOUNTER — Telehealth: Payer: Self-pay | Admitting: Family

## 2022-03-20 NOTE — Telephone Encounter (Signed)
Copied from [place] [id number]. Topic: Referral - Request for Referral ?>> Mar 20, 2022  9:14 AM Tessa Lerner A wrote: ?Has patient seen PCP for this complaint? Yes.   ?*If NO, is insurance requiring patient see PCP for this issue before PCP can refer them? ?Referral for which specialty: Dermatology  ?Preferred provider/office: Patient has no preference  ?Reason for referral: Spots on arms ?

## 2022-03-20 NOTE — Telephone Encounter (Signed)
LM on pts VM to call back and schedule an office visit ?

## 2022-03-21 NOTE — Telephone Encounter (Signed)
Addendum to encounter 03/14/2022 completed. Dermatology referral ordered.

## 2022-03-21 NOTE — Telephone Encounter (Signed)
Pt states that he did have a converstation with Amy in his 03/14/22 OV about a dermatology Referral. Please advise ?

## 2022-03-21 NOTE — Addendum Note (Signed)
Addended by: Camillia Herter on: 03/21/2022 12:53 PM ? ? Modules accepted: Orders ? ?

## 2022-03-21 NOTE — Telephone Encounter (Signed)
Please review. Message was sent to the wrong office. ? ?KP

## 2022-03-31 ENCOUNTER — Ambulatory Visit (HOSPITAL_COMMUNITY)
Admission: RE | Admit: 2022-03-31 | Discharge: 2022-03-31 | Disposition: A | Discharge status: Home / Self Care | Payer: Medicare HMO | Source: Ambulatory Visit | Attending: Pulmonary Disease | Admitting: Pulmonary Disease

## 2022-03-31 DIAGNOSIS — R0609 Other forms of dyspnea: Secondary | ICD-10-CM | POA: Diagnosis present

## 2022-03-31 DIAGNOSIS — D869 Sarcoidosis, unspecified: Secondary | ICD-10-CM | POA: Diagnosis present

## 2022-03-31 MED ORDER — IOHEXOL 300 MG/ML  SOLN
100.0000 mL | Freq: Once | INTRAMUSCULAR | Status: AC | PRN
  Administered 2022-03-31: 100 mL via INTRAVENOUS

## 2022-04-01 ENCOUNTER — Ambulatory Visit: Payer: Self-pay | Admitting: *Deleted

## 2022-04-01 NOTE — Telephone Encounter (Signed)
  Chief Complaint: Right big toe is swollen and painful Symptoms: above Frequency: N/A Pertinent Negatives: Patient denies it looking like it's broken or bruised Disposition: '[]'$ ED /'[]'$ Urgent Care (no appt availability in office) / '[x]'$ Appointment(In office/virtual)/ '[]'$  [place] [facility]/ '[]'$ Home Care/ '[]'$ Refused Recommended Disposition /'[]'$ [place] [facility]/ '[]'$  Follow-up with PCP Additional Notes: Appt made with Dr. Redmond Pulling for 04/02/2022 at 2:20.

## 2022-04-01 NOTE — Telephone Encounter (Signed)
Reason for Disposition  [1] MODERATE pain (e.g., interferes with normal activities, limping) AND [2] present > 3 days  Answer Assessment - Initial Assessment Questions 1. ONSET: "When did the pain start?"      I don't remember hurting it.   I was working in the yard.  On the way in from the yard it started hurting. 2. LOCATION: "Where is the pain located?"      Hurting big toe and is swollen.   I can't bend my big toe.   Right toe.   "I don't think it's broken".   I can walk on the outside of my foot. 3. PAIN: "How bad is the pain?"    (Scale 1-10; or mild, moderate, severe)  - MILD (1-3): doesn't interfere with normal activities.   - MODERATE (4-7): interferes with normal activities (e.g., work or school) or awakens from sleep, limping.   - SEVERE (8-10): excruciating pain, unable to do any normal activities, unable to walk.      Moderate 4. WORK OR EXERCISE: "Has there been any recent work or exercise that involved this part of the body?"      I was working in the yard but don't remember hurting it. 5. CAUSE: "What do you think is causing the foot pain?"     I don't know 6. OTHER SYMPTOMS: "Do you have any other symptoms?" (e.g., leg pain, rash, fever, numbness)     No 7. PREGNANCY: "Is there any chance you are pregnant?" "When was your last menstrual period?"     N/A  Protocols used: Foot Pain-A-AH

## 2022-04-02 ENCOUNTER — Ambulatory Visit (INDEPENDENT_AMBULATORY_CARE_PROVIDER_SITE_OTHER): Payer: Medicare HMO | Admitting: Family Medicine

## 2022-04-02 ENCOUNTER — Encounter: Payer: Self-pay | Admitting: Family Medicine

## 2022-04-02 VITALS — BP 117/69 | HR 63 | Temp 98.0°F | Resp 16 | Wt 204.2 lb

## 2022-04-02 DIAGNOSIS — Z6833 Body mass index (BMI) 33.0-33.9, adult: Secondary | ICD-10-CM | POA: Insufficient documentation

## 2022-04-02 DIAGNOSIS — M545 Low back pain, unspecified: Secondary | ICD-10-CM | POA: Insufficient documentation

## 2022-04-02 DIAGNOSIS — E1165 Type 2 diabetes mellitus with hyperglycemia: Secondary | ICD-10-CM | POA: Insufficient documentation

## 2022-04-02 DIAGNOSIS — E291 Testicular hypofunction: Secondary | ICD-10-CM | POA: Insufficient documentation

## 2022-04-02 DIAGNOSIS — D509 Iron deficiency anemia, unspecified: Secondary | ICD-10-CM | POA: Insufficient documentation

## 2022-04-02 DIAGNOSIS — E559 Vitamin D deficiency, unspecified: Secondary | ICD-10-CM | POA: Insufficient documentation

## 2022-04-02 DIAGNOSIS — M109 Gout, unspecified: Secondary | ICD-10-CM

## 2022-04-02 DIAGNOSIS — E7800 Pure hypercholesterolemia, unspecified: Secondary | ICD-10-CM | POA: Insufficient documentation

## 2022-04-02 MED ORDER — COLCHICINE 0.6 MG PO TABS: 0.6000 mg | ORAL_TABLET | Freq: Every day | ORAL | 0 refills | Status: DC

## 2022-04-02 NOTE — Progress Notes (Unsigned)
Patient came in and is c/o swollen right big toe x 3 days. Patient said there is pain with walking  3/10   Patient is a DMll

## 2022-04-03 ENCOUNTER — Encounter: Payer: Self-pay | Admitting: Family Medicine

## 2022-04-03 NOTE — Progress Notes (Signed)
Established Patient Office Visit  Subjective    Patient ID: Max Ryan, male    DOB: Sep 11, 1946  Age: 76 y.o. MRN: [id number]  CC:  Chief Complaint  Patient presents with   Toe Pain    HPI Max Ryan presents for complaint of pain in right great toe. Patinet denies known trauma or injury. He does reports a purine rich diet before the pain started.    Outpatient Encounter Medications as of 04/02/2022  Medication Sig   aspirin EC 81 MG tablet Take 81 mg by mouth daily. Swallow whole.   carbidopa-levodopa (SINEMET IR) 25-100 MG tablet Take 1 tablet by mouth at bedtime.   colchicine 0.6 MG tablet Take 1 tablet (0.6 mg total) by mouth daily.   gabapentin (NEURONTIN) 600 MG tablet Take 600 mg by mouth 2 (two) times daily.   losartan (COZAAR) 25 MG tablet Take 1 tablet (25 mg total) by mouth in the morning.   metFORMIN (GLUCOPHAGE) 500 MG tablet Take 1 tablet (500 mg total) by mouth 2 (two) times daily.   traMADol (ULTRAM) 50 MG tablet Take 1 tablet (50 mg total) by mouth every 4 (four) hours as needed.   traZODone (DESYREL) 100 MG tablet Take 100 mg by mouth at bedtime.   atorvastatin (LIPITOR) 40 MG tablet Take 1 tablet (40 mg total) by mouth daily.   No facility-administered encounter medications on file as of 04/02/2022.    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    Past Surgical History:  Procedure Laterality Date   ANTERIOR CERVICAL DECOMPRESSION/DISCECTOMY FUSION 4 LEVELS N/A 08/17/2019   Procedure: ANTERIOR CERVICAL DECOMPRESSION FUSION CERVICAL THREE-FOUR, CERVICAL FOUR-FIVE, CERVICAL FIVE-SIX, CERVICAL SIX-SEVEN WITH INSTRUMENTATION AND ALLOGRAFT;  Surgeon: Phylliss Bob, MD;  Location: [place];  Service: Orthopedics;   Laterality: N/A;   CARPAL TUNNEL RELEASE Right    CHOLECYSTECTOMY     COLONOSCOPY     CYSTOSCOPY/URETEROSCOPY/HOLMIUM LASER/STENT PLACEMENT Right 07/19/2019   Procedure: CYSTOSCOPY/URETEROSCOPY/HOLMIUM LASER/STENT PLACEMENT;  Surgeon: Ardis Hughs, MD;  [facility];  Service: Urology;  Laterality: Right;   HERNIA REPAIR     JOINT REPLACEMENT Left    knee   KNEE SURGERY Left    KYPHOPLASTY N/A 11/06/2021   Procedure: THORACIC EIGHT, THORACIC NINE KHYPHOPLASTY;  Surgeon: Phylliss Bob, MD;  Location: [place];  Service: Orthopedics;  Laterality: N/A;   LAPAROSCOPIC ROUX-EN-Y GASTRIC BYPASS WITH UPPER ENDOSCOPY AND REMOVAL OF LAP BAND     POSTERIOR CERVICAL FUSION/FORAMINOTOMY N/A 04/26/2020   Procedure: POSTERIOR CERVICAL DECOMPRESSION CERVICAL SIX- THORACIC ONE;  Surgeon: Phylliss Bob, MD;  Location: [place];  Service: Orthopedics;  Laterality: N/A;   SPINAL FUSION  2004   TONSILLECTOMY      Family History  Problem Relation Age of Onset   CAD Father     Social History   Socioeconomic History   Marital status: Married    Spouse name: Not on file   Number of children: Not on file   Years of education: Not on file   Highest education level: Not on file  Occupational History   Not on file  Tobacco Use   Smoking status: Former  Types: Cigarettes, E-cigarettes    Quit date: 2005    Years since quitting: 18.4   Smokeless tobacco: Never  Vaping Use   Vaping Use: Never used  Substance and Sexual Activity   Alcohol use: Not Currently    Alcohol/week: 42.0 standard drinks    Types: 42 Glasses of wine per week    Comment: "Haven't had a drink since 9/1 2022" - per pt   Drug use: Yes    Types: Marijuana    Comment: occ   Sexual activity: Not on file  Other Topics Concern   Not on file  Social History Narrative   Not on file   Social Determinants of Health   Financial Resource Strain: Not on file  Food Insecurity: Not on file  Transportation Needs: Not on  file  Physical Activity: Not on file  Stress: Not on file  Social Connections: Not on file  Intimate Partner Violence: Not on file    Review of Systems  All other systems reviewed and are negative.      Objective    BP 117/69   Pulse 63   Temp 98 F (36.7 C) (Oral)   Resp 16   Wt 204 lb 3.2 oz (92.6 kg)   SpO2 96%   BMI 31.06 kg/m   Physical Exam Vitals and nursing note reviewed.  Constitutional:      General: He is not in acute distress. Cardiovascular:     Rate and Rhythm: Normal rate and regular rhythm.  Pulmonary:     Effort: Pulmonary effort is normal.     Breath sounds: Normal breath sounds.  Musculoskeletal:     Right foot: Tenderness (great toe proximal joint) present.     Left foot: Normal.  Neurological:     General: No focal deficit present.     Mental Status: He is alert and oriented to person, place, and time.        Assessment & Plan:   1. 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, unspecified cause Clinically appears to be gout. Colchicine prescribed. Info given regarding limiting purines in diet.      No follow-ups on file.   Becky Sax, MD

## 2022-04-14 LAB — HM DIABETES EYE EXAM

## 2022-04-25 ENCOUNTER — Other Ambulatory Visit: Payer: Self-pay | Admitting: Family Medicine

## 2022-04-25 NOTE — Telephone Encounter (Signed)
Requested Prescriptions  Pending Prescriptions Disp Refills  . colchicine 0.6 MG tablet [Pharmacy Med Name: COLCHICINE 0.6 MG TABLET] 30 tablet 0    Sig: TAKE 1 TABLET BY [place] DAY     Endocrinology:  Gout Agents - colchicine Failed - 04/25/2022 12:34 PM      Failed - Cr in normal range and within 360 days    Creatinine, Ser  Date Value Ref Range Status  03/14/2022 0.75 (L) 0.76 - 1.27 mg/dL Final         Passed - ALT in normal range and within 360 days    ALT  Date Value Ref Range Status  11/20/2021 39 0 - 44 IU/L Final         Passed - AST in normal range and within 360 days    AST  Date Value Ref Range Status  11/20/2021 25 0 - 40 IU/L Final         Passed - Valid encounter within last 12 months    Recent Outpatient Visits          3 weeks ago 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, unspecified cause   Primary Care at [facility], MD   1 month ago Essential (primary) hypertension   Primary Care at [facility], Amy J, NP   2 months ago Essential hypertension   Primary Care at [facility], Kriste Basque, NP      Future Appointments            In 1 month Laurin Coder, MD [place] [facility]   In 2 months Camillia Herter, NP Primary Care at [facility] - CBC within normal limits and completed in the last 12 months    WBC  Date Value Ref Range Status  10/29/2021 7.2 4.0 - 10.5 K/uL Final   RBC  Date Value Ref Range Status  10/29/2021 4.53 4.22 - 5.81 MIL/uL Final   Hemoglobin  Date Value Ref Range Status  10/29/2021 14.6 13.0 - 17.0 g/dL Final   HCT  Date Value Ref Range Status  10/29/2021 43.4 39.0 - 52.0 % Final   MCHC  Date Value Ref Range Status  10/29/2021 33.6 30.0 - 36.0 g/dL Final   [facility]  Date Value Ref Range Status  10/29/2021 32.2 26.0 - 34.0 pg Final   MCV  Date Value Ref Range Status  10/29/2021 95.8 80.0 - 100.0 fL Final   No results found for: "PLTCOUNTKUC",  "LABPLAT", "POCPLA" RDW  Date Value Ref Range Status  10/29/2021 13.0 11.5 - 15.5 % Final

## 2022-04-30 ENCOUNTER — Ambulatory Visit (HOSPITAL_COMMUNITY)
Admission: RE | Admit: 2022-04-30 | Discharge: 2022-04-30 | Disposition: A | Discharge status: Home / Self Care | Payer: Medicare HMO | Source: Ambulatory Visit | Attending: Cardiovascular Disease | Admitting: Cardiovascular Disease

## 2022-04-30 DIAGNOSIS — H349 Unspecified retinal vascular occlusion: Secondary | ICD-10-CM | POA: Diagnosis not present

## 2022-05-18 ENCOUNTER — Other Ambulatory Visit: Payer: Self-pay | Admitting: Family Medicine

## 2022-05-19 NOTE — Telephone Encounter (Signed)
Requested Prescriptions  Pending Prescriptions Disp Refills  . colchicine 0.6 MG tablet [Pharmacy Med Name: COLCHICINE 0.6 MG TABLET] 30 tablet 2    Sig: TAKE 1 TABLET BY MOUTH EVERY DAY     Endocrinology:  Gout Agents - colchicine Failed - 05/18/2022  8:51 AM      Failed - Cr in normal range and within 360 days    Creatinine, Ser  Date Value Ref Range Status  03/14/2022 0.75 (L) 0.76 - 1.27 mg/dL Final         Passed - ALT in normal range and within 360 days    ALT  Date Value Ref Range Status  11/20/2021 39 0 - 44 IU/L Final         Passed - AST in normal range and within 360 days    AST  Date Value Ref Range Status  11/20/2021 25 0 - 40 IU/L Final         Passed - Valid encounter within last 12 months    Recent Outpatient Visits          1 month ago 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, unspecified cause   Primary Care at [facility], MD   2 months ago Essential (primary) hypertension   Primary Care at [facility], Amy J, NP   2 months ago    Primary Care at [facility], Amy J, NP   3 months ago Essential hypertension   Primary Care at [facility], Kriste Basque, NP      Future Appointments            In 1 week Laurin Coder, MD [place] [facility]   In 1 month Camillia Herter, NP Primary Care at [facility] - CBC within normal limits and completed in the last 12 months    WBC  Date Value Ref Range Status  10/29/2021 7.2 4.0 - 10.5 K/uL Final   RBC  Date Value Ref Range Status  10/29/2021 4.53 4.22 - 5.81 MIL/uL Final   Hemoglobin  Date Value Ref Range Status  10/29/2021 14.6 13.0 - 17.0 g/dL Final   HCT  Date Value Ref Range Status  10/29/2021 43.4 39.0 - 52.0 % Final   MCHC  Date Value Ref Range Status  10/29/2021 33.6 30.0 - 36.0 g/dL Final   [facility]  Date Value Ref Range Status  10/29/2021 32.2 26.0 - 34.0 pg Final   MCV  Date Value Ref Range Status  10/29/2021 95.8  80.0 - 100.0 fL Final   No results found for: "PLTCOUNTKUC", "LABPLAT", "POCPLA" RDW  Date Value Ref Range Status  10/29/2021 13.0 11.5 - 15.5 % Final

## 2022-05-27 ENCOUNTER — Ambulatory Visit: Payer: Medicare HMO | Admitting: Pulmonary Disease

## 2022-07-06 ENCOUNTER — Other Ambulatory Visit: Payer: Self-pay | Admitting: Family

## 2022-07-06 DIAGNOSIS — E119 Type 2 diabetes mellitus without complications: Secondary | ICD-10-CM

## 2022-07-07 NOTE — Telephone Encounter (Signed)
Requested medications are due for refill today.  yes  Requested medications are on the active medications list.  yes  Last refill. 03/14/2022 #240 0 refills  Future visit scheduled.   yes  Notes to clinic.  PCP listed is Cecile Hearing.    Requested Prescriptions  Pending Prescriptions Disp Refills   metFORMIN (GLUCOPHAGE) 500 MG tablet [Pharmacy Med Name: METFORMIN HCL 500 MG TABLET] 240 tablet 0    Sig: TAKE 1 TABLET BY MOUTH TWICE A DAY     Endocrinology:  Diabetes - Biguanides Failed - 07/06/2022  9:58 AM      Failed - Cr in normal range and within 360 days    Creatinine, Ser  Date Value Ref Range Status  03/14/2022 0.75 (L) 0.76 - 1.27 mg/dL Final         Failed - B12 Level in normal range and within 720 days    No results found for: "VITAMINB12"       Passed - HBA1C is between 0 and 7.9 and within 180 days    Hemoglobin A1C  Date Value Ref Range Status  03/14/2022 6.0 (A) 4.0 - 5.6 % Final   Hgb A1c MFr Bld  Date Value Ref Range Status  10/29/2021 6.4 (H) 4.8 - 5.6 % Final    Comment:    (NOTE) Pre diabetes:          5.7%-6.4%  Diabetes:              >6.4%  Glycemic control for   <7.0% adults with diabetes          Passed - eGFR in normal range and within 360 days    GFR calc Af Amer  Date Value Ref Range Status  04/19/2020 >60 >60 mL/min Final   GFR, Estimated  Date Value Ref Range Status  10/29/2021 >60 >60 mL/min Final    Comment:    (NOTE) Calculated using the CKD-EPI Creatinine Equation (2021)    eGFR  Date Value Ref Range Status  03/14/2022 95 >59 mL/min/1.73 Final         Passed - Valid encounter within last 6 months    Recent Outpatient Visits           3 months ago 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, unspecified cause   Primary Care at [facility], MD   3 months ago Essential (primary) hypertension   Primary Care at [facility], Amy J, NP   4 months ago    Primary Care at [facility], Amy  J, NP   5 months ago Essential hypertension   Primary Care at [facility], Kriste Basque, NP       Future Appointments             In 1 week Camillia Herter, NP Primary Care at [facility]   In 3 weeks Laurin Coder, MD [place] [facility]            Passed - CBC within normal limits and completed in the last 12 months    WBC  Date Value Ref Range Status  10/29/2021 7.2 4.0 - 10.5 K/uL Final   RBC  Date Value Ref Range Status  10/29/2021 4.53 4.22 - 5.81 MIL/uL Final   Hemoglobin  Date Value Ref Range Status  10/29/2021 14.6 13.0 - 17.0 g/dL Final   HCT  Date Value Ref Range Status  10/29/2021 43.4 39.0 - 52.0 % Final   MCHC  Date Value Ref Range Status  10/29/2021 33.6 30.0 - 36.0 g/dL Final   [facility]  Date Value Ref Range Status  10/29/2021 32.2 26.0 - 34.0 pg Final   MCV  Date Value Ref Range Status  10/29/2021 95.8 80.0 - 100.0 fL Final   No results found for: "PLTCOUNTKUC", "LABPLAT", "POCPLA" RDW  Date Value Ref Range Status  10/29/2021 13.0 11.5 - 15.5 % Final

## 2022-07-07 NOTE — Progress Notes (Signed)
Patient ID: Max Ryan, male    DOB: Nov 19, 1945  MRN: [id number]  CC: Chronic Care Management   Subjective: Max Ryan is a 76 y.o. male who presents for chronic care management.   His concerns today include:  Doing well on blood pressure and diabetes medications without issues or concerns. He is established with Orthopedics and has appointment soon for management of right arm pain.   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   Vitamin D deficiency 04/02/2022   Compression fracture of body of thoracic vertebra ([place]) 11/06/2021   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   Mixed hyperlipidemia 04/30/2021   Type 2 diabetes mellitus treated without insulin ([place]) 04/30/2021   Morbid obesity ([place]) 04/30/2021   Hepatic steatosis 04/30/2021   Coronary artery calcification 04/30/2021   Essential hypertension 04/30/2021   Myeloradiculopathy 08/17/2019   Primary osteoarthritis of left knee 01/29/2018   Primary hypertension 06/30/2017   Displacement of lumbar intervertebral disc without myelopathy 07/24/2015   Lumbosacral spondylosis without myelopathy 07/04/2015     Current Outpatient Medications on File Prior to Visit  Medication Sig Dispense Refill   aspirin EC 81 MG tablet Take 81 mg by mouth daily. Swallow whole.     carbidopa-levodopa (SINEMET IR) 25-100 MG tablet Take 1 tablet by mouth at bedtime.     colchicine 0.6 MG tablet TAKE 1 TABLET BY MOUTH EVERY DAY 30 tablet 2   gabapentin (NEURONTIN) 600 MG tablet Take 600 mg by mouth 2 (two) times daily.     meloxicam (MOBIC) 15 MG tablet Take 15 mg by mouth daily.     metFORMIN (GLUCOPHAGE) 500 MG tablet TAKE 1 TABLET BY MOUTH TWICE A DAY 240 tablet 0   tamsulosin  (FLOMAX) 0.4 MG CAPS capsule Take 0.4 mg by mouth daily.     traMADol (ULTRAM) 50 MG tablet Take 1 tablet (50 mg total) by mouth every 4 (four) hours as needed. 30 tablet 2   traZODone (DESYREL) 100 MG tablet Take 100 mg by mouth at bedtime.     No current facility-administered medications on file prior to visit.    No Known Allergies  Social History   Socioeconomic History   Marital status: Married    Spouse name: Not on file   Number of children: Not on file   Years of education: Not on file   Highest education level: Not on file  Occupational History   Not on file  Tobacco Use   Smoking status: Former    Types: Cigarettes, E-cigarettes    Quit date: 2005    Years since quitting: 18.6   Smokeless tobacco: Never  Vaping Use   Vaping Use: Never used  Substance and Sexual Activity   Alcohol use: Not Currently    Alcohol/week: 42.0 standard drinks of alcohol    Types: 42 Glasses of wine per week    Comment: "Haven't had a drink since 9/1 2022" - per pt   Drug use: Yes    Types: Marijuana    Comment: occ   Sexual activity: Not on file  Other Topics Concern   Not on file  Social History Narrative   Not on file   Social Determinants of Health   Financial Resource Strain: Not  on file  Food Insecurity: Not on file  Transportation Needs: Not on file  Physical Activity: Not on file  Stress: Not on file  Social Connections: Not on file  Intimate Partner Violence: Not on file    Family History  Problem Relation Age of Onset   CAD Father     Past Surgical History:  Procedure Laterality Date   ANTERIOR CERVICAL DECOMPRESSION/DISCECTOMY FUSION 4 LEVELS N/A 08/17/2019   Procedure: ANTERIOR CERVICAL DECOMPRESSION FUSION CERVICAL THREE-FOUR, CERVICAL FOUR-FIVE, CERVICAL FIVE-SIX, CERVICAL SIX-SEVEN WITH INSTRUMENTATION AND ALLOGRAFT;  Surgeon: Phylliss Bob, MD;  Location: [place];  Service: Orthopedics;  Laterality: N/A;   CARPAL TUNNEL RELEASE Right    CHOLECYSTECTOMY      COLONOSCOPY     CYSTOSCOPY/URETEROSCOPY/HOLMIUM LASER/STENT PLACEMENT Right 07/19/2019   Procedure: CYSTOSCOPY/URETEROSCOPY/HOLMIUM LASER/STENT PLACEMENT;  Surgeon: Ardis Hughs, MD;  [facility];  Service: Urology;  Laterality: Right;   HERNIA REPAIR     JOINT REPLACEMENT Left    knee   KNEE SURGERY Left    KYPHOPLASTY N/A 11/06/2021   Procedure: THORACIC EIGHT, THORACIC NINE KHYPHOPLASTY;  Surgeon: Phylliss Bob, MD;  Location: [place];  Service: Orthopedics;  Laterality: N/A;   LAPAROSCOPIC ROUX-EN-Y GASTRIC BYPASS WITH UPPER ENDOSCOPY AND REMOVAL OF LAP BAND     POSTERIOR CERVICAL FUSION/FORAMINOTOMY N/A 04/26/2020   Procedure: POSTERIOR CERVICAL DECOMPRESSION CERVICAL SIX- THORACIC ONE;  Surgeon: Phylliss Bob, MD;  Location: [place];  Service: Orthopedics;  Laterality: N/A;   SPINAL FUSION  2004   TONSILLECTOMY      ROS: Review of Systems Negative except as stated above  PHYSICAL EXAM: BP 112/77   Pulse 77   Temp 98.1 F (36.7 C) (Oral)   Resp 16   Wt 215 lb 3.7 oz (97.6 kg)   SpO2 93%   BMI 32.73 kg/m   Physical Exam HENT:     Head: Normocephalic and atraumatic.  Eyes:     Extraocular Movements: Extraocular movements intact.     Conjunctiva/sclera: Conjunctivae normal.     Pupils: Pupils are equal, round, and reactive to light.  Cardiovascular:     Rate and Rhythm: Normal rate and regular rhythm.     Pulses: Normal pulses.     Heart sounds: Normal heart sounds.  Pulmonary:     Effort: Pulmonary effort is normal.     Breath sounds: Normal breath sounds.  Musculoskeletal:     Cervical back: Normal range of motion and neck supple.  Neurological:     General: No focal deficit present.     Mental Status: He is alert and oriented to person, place, and time.  Psychiatric:        Mood and Affect: Mood normal.        Behavior: Behavior normal.     ASSESSMENT AND PLAN: 1. Essential (primary) hypertension - Continue Losartan as prescribed.  -  Counseled on blood pressure goal of less than 140/90, low-sodium, DASH diet, medication compliance, 150 minutes of moderate intensity exercise per week as tolerated. Discussed medication compliance, adverse effects. - Follow-up with primary care in 3 months or sooner if needed.  - losartan (COZAAR) 25 MG tablet; Take 1 tablet (25 mg total) by mouth in the morning.  Dispense: 30 tablet; Refill: 2  2. Type 2 diabetes mellitus without complication, without long-term current use of insulin (HCC) - Continue Metformin as prescribed. No refills needed as of present. - Discussed the importance of healthy eating habits, low-carbohydrate diet, low-sugar diet, regular aerobic exercise (at  least 150 minutes a week as tolerated) and medication compliance to achieve or maintain control of diabetes. - Update hemoglobin A1c.  - Follow-up with primary care in 3 months or sooner if needed.  - Hemoglobin A1c  3. Mixed hyperlipidemia - Continue Atorvastatin as prescribed.  - Follow-up with primary provider as scheduled.  - atorvastatin (LIPITOR) 40 MG tablet; Take 1 tablet (40 mg total) by mouth daily.  Dispense: 30 tablet; Refill: 2  4. Need for influenza vaccination - Administered.  - Flu Vaccine QUAD 70moIM (Fluarix, Fluzone & Alfiuria Quad PF)    Patient was given the opportunity to ask questions.  Patient verbalized understanding of the plan and was able to repeat key elements of the plan. Patient was given clear instructions to go to Emergency Department or return to medical center if symptoms don't improve, worsen, or new problems develop.The patient verbalized understanding.   Orders Placed This Encounter  Procedures   Flu Vaccine QUAD 643moM (Fluarix, Fluzone & Alfiuria Quad PF)   Hemoglobin A1c     Requested Prescriptions   Signed Prescriptions Disp Refills   losartan (COZAAR) 25 MG tablet 30 tablet 2    Sig: Take 1 tablet (25 mg total) by mouth in the morning.   atorvastatin (LIPITOR) 40  MG tablet 30 tablet 2    Sig: Take 1 tablet (40 mg total) by mouth daily.    Return in about 3 months (around 10/14/2022) for Follow-Up or next available chronic care mgmt.  AmCamillia HerterNP

## 2022-07-15 ENCOUNTER — Ambulatory Visit (INDEPENDENT_AMBULATORY_CARE_PROVIDER_SITE_OTHER): Payer: Medicare HMO | Admitting: Family

## 2022-07-15 ENCOUNTER — Encounter: Payer: Self-pay | Admitting: Family

## 2022-07-15 VITALS — BP 112/77 | HR 77 | Temp 98.1°F | Resp 16 | Wt 215.2 lb

## 2022-07-15 DIAGNOSIS — E119 Type 2 diabetes mellitus without complications: Secondary | ICD-10-CM

## 2022-07-15 DIAGNOSIS — Z23 Encounter for immunization: Secondary | ICD-10-CM | POA: Diagnosis not present

## 2022-07-15 DIAGNOSIS — E782 Mixed hyperlipidemia: Secondary | ICD-10-CM

## 2022-07-15 DIAGNOSIS — I10 Essential (primary) hypertension: Secondary | ICD-10-CM

## 2022-07-15 MED ORDER — LOSARTAN POTASSIUM 25 MG PO TABS: 25.0000 mg | ORAL_TABLET | Freq: Every morning | ORAL | 2 refills | Status: DC

## 2022-07-15 MED ORDER — ATORVASTATIN CALCIUM 40 MG PO TABS: 40.0000 mg | ORAL_TABLET | Freq: Every day | ORAL | 2 refills | Status: DC

## 2022-07-15 NOTE — Progress Notes (Signed)
Patient c/o right arm nerve pain 6/10. Patient said that he will be going and get that check in a few days.  Patient given flu  vaccine

## 2022-07-15 NOTE — Patient Instructions (Signed)
Hypertension, Adult High blood pressure (hypertension) is when the force of blood pumping through the arteries is too strong. The arteries are the blood vessels that carry blood from the heart throughout the body. Hypertension forces the heart to work harder to pump blood and may cause arteries to become narrow or stiff. Untreated or uncontrolled hypertension can lead to a heart attack, heart failure, a stroke, kidney disease, and other problems. A blood pressure reading consists of a higher number over a lower number. Ideally, your blood pressure should be below 120/80. The first ("top") number is called the systolic pressure. It is a measure of the pressure in your arteries as your heart beats. The second ("bottom") number is called the diastolic pressure. It is a measure of the pressure in your arteries as the heart relaxes. What are the causes? The exact cause of this condition is not known. There are some conditions that result in high blood pressure. What increases the risk? Certain factors may make you more likely to develop high blood pressure. Some of these risk factors are under your control, including: Smoking. Not getting enough exercise or physical activity. Being overweight. Having too much fat, sugar, calories, or salt (sodium) in your diet. Drinking too much alcohol. Other risk factors include: Having a personal history of heart disease, diabetes, high cholesterol, or kidney disease. Stress. Having a family history of high blood pressure and high cholesterol. Having obstructive sleep apnea. Age. The risk increases with age. What are the signs or symptoms? High blood pressure may not cause symptoms. Very high blood pressure (hypertensive crisis) may cause: Headache. Fast or irregular heartbeats (palpitations). Shortness of breath. Nosebleed. Nausea and vomiting. Vision changes. Severe chest pain, dizziness, and seizures. How is this diagnosed? This condition is diagnosed by  measuring your blood pressure while you are seated, with your arm resting on a flat surface, your legs uncrossed, and your feet flat on the floor. The cuff of the blood pressure monitor will be placed directly against the skin of your upper arm at the level of your heart. Blood pressure should be measured at least twice using the same arm. Certain conditions can cause a difference in blood pressure between your right and left arms. If you have a high blood pressure reading during one visit or you have normal blood pressure with other risk factors, you may be asked to: Return on a different day to have your blood pressure checked again. Monitor your blood pressure at home for 1 week or longer. If you are diagnosed with hypertension, you may have other blood or imaging tests to help your health care provider understand your overall risk for other conditions. How is this treated? This condition is treated by making healthy lifestyle changes, such as eating healthy foods, exercising more, and reducing your alcohol intake. You may be referred for counseling on a healthy diet and physical activity. Your health care provider may prescribe medicine if lifestyle changes are not enough to get your blood pressure under control and if: Your systolic blood pressure is above 130. Your diastolic blood pressure is above 80. Your personal target blood pressure may vary depending on your medical conditions, your age, and other factors. Follow these instructions at home: Eating and drinking  Eat a diet that is high in fiber and potassium, and low in sodium, added sugar, and fat. An example of this eating plan is called the DASH diet. DASH stands for Dietary Approaches to Stop Hypertension. To eat this way: Eat   plenty of fresh fruits and vegetables. Try to fill one half of your plate at each meal with fruits and vegetables. Eat whole grains, such as whole-wheat pasta, brown rice, or whole-grain bread. Fill about one  fourth of your plate with whole grains. Eat or drink low-fat dairy products, such as skim milk or low-fat yogurt. Avoid fatty cuts of meat, processed or cured meats, and poultry with skin. Fill about one fourth of your plate with lean proteins, such as fish, chicken without skin, beans, eggs, or tofu. Avoid pre-made and processed foods. These tend to be higher in sodium, added sugar, and fat. Reduce your daily sodium intake. Many people with hypertension should eat less than 1,500 mg of sodium a day. Do not drink alcohol if: Your health care provider tells you not to drink. You are pregnant, may be pregnant, or are planning to become pregnant. If you drink alcohol: Limit how much you have to: 0-1 drink a day for women. 0-2 drinks a day for men. Know how much alcohol is in your drink. In the [place] one drink equals one 12 oz bottle of beer (355 mL), one 5 oz glass of wine (148 mL), or one 1 oz glass of hard liquor (44 mL). Lifestyle  Work with your health care provider to maintain a healthy body weight or to lose weight. Ask what an ideal weight is for you. Get at least 30 minutes of exercise that causes your heart to beat faster (aerobic exercise) most days of the week. Activities may include walking, swimming, or biking. Include exercise to strengthen your muscles (resistance exercise), such as Pilates or lifting weights, as part of your weekly exercise routine. Try to do these types of exercises for 30 minutes at least 3 days a week. Do not use any products that contain nicotine or tobacco. These products include cigarettes, chewing tobacco, and vaping devices, such as e-cigarettes. If you need help quitting, ask your health care provider. Monitor your blood pressure at home as told by your health care provider. Keep all follow-up visits. This is important. Medicines Take over-the-counter and prescription medicines only as told by your health care provider. Follow directions carefully. Blood  pressure medicines must be taken as prescribed. Do not skip doses of blood pressure medicine. Doing this puts you at risk for problems and can make the medicine less effective. Ask your health care provider about side effects or reactions to medicines that you should watch for. Contact a health care provider if you: Think you are having a reaction to a medicine you are taking. Have headaches that keep coming back (recurring). Feel dizzy. Have swelling in your ankles. Have trouble with your vision. Get help right away if you: Develop a severe headache or confusion. Have unusual weakness or numbness. Feel faint. Have severe pain in your chest or abdomen. Vomit repeatedly. Have trouble breathing. These symptoms may be an emergency. Get help right away. Call 911. Do not wait to see if the symptoms will go away. Do not drive yourself to the hospital. Summary Hypertension is when the force of blood pumping through your arteries is too strong. If this condition is not controlled, it may put you at risk for serious complications. Your personal target blood pressure may vary depending on your medical conditions, your age, and other factors. For most people, a normal blood pressure is less than 120/80. Hypertension is treated with lifestyle changes, medicines, or a combination of both. Lifestyle changes include losing weight, eating a healthy,   low-sodium diet, exercising more, and limiting alcohol. This information is not intended to replace advice given to you by your health care provider. Make sure you discuss any questions you have with your health care provider. Document Revised: 09/03/2021 Document Reviewed: 09/03/2021 Elsevier Patient Education  2023 Elsevier Inc.  

## 2022-07-16 LAB — HEMOGLOBIN A1C
Est. average glucose Bld gHb Est-mCnc: 131 mg/dL
Hgb A1c MFr Bld: 6.2 % — ABNORMAL HIGH (ref 4.8–5.6)

## 2022-07-28 ENCOUNTER — Telehealth: Payer: Self-pay | Admitting: Family Medicine

## 2022-07-28 ENCOUNTER — Other Ambulatory Visit: Payer: Self-pay | Admitting: Family Medicine

## 2022-07-28 NOTE — Telephone Encounter (Signed)
Medication Refill - Medication: gabapentin (NEURONTIN) 600 MG tablet  Has the patient contacted their pharmacy? Yes.     Preferred Pharmacy (with phone number or street name):  [id number] [place] Phone:  [phone] Fax:  [phone]    Has the patient been seen for an appointment in the last year OR does the patient have an upcoming appointment? Yes.    Agent: Please be advised that RX refills may take up to 3 business days. We ask that you follow-up with your pharmacy.

## 2022-07-29 ENCOUNTER — Emergency Department (HOSPITAL_COMMUNITY)
Admission: EM | Admit: 2022-07-29 | Discharge: 2022-07-29 | Discharge status: Against Medical Advice | Payer: Medicare HMO | Attending: Emergency Medicine | Admitting: Emergency Medicine

## 2022-07-29 ENCOUNTER — Encounter (HOSPITAL_COMMUNITY): Payer: Self-pay

## 2022-07-29 ENCOUNTER — Other Ambulatory Visit: Payer: Self-pay

## 2022-07-29 ENCOUNTER — Ambulatory Visit: Payer: Self-pay

## 2022-07-29 ENCOUNTER — Telehealth: Payer: Medicare HMO | Admitting: Family Medicine

## 2022-07-29 DIAGNOSIS — R519 Headache, unspecified: Secondary | ICD-10-CM | POA: Insufficient documentation

## 2022-07-29 DIAGNOSIS — M542 Cervicalgia: Secondary | ICD-10-CM | POA: Insufficient documentation

## 2022-07-29 DIAGNOSIS — Z5321 Procedure and treatment not carried out due to patient leaving prior to being seen by health care provider: Secondary | ICD-10-CM | POA: Diagnosis not present

## 2022-07-29 LAB — CBC WITH DIFFERENTIAL/PLATELET
Abs Immature Granulocytes: 0.01 10*3/uL (ref 0.00–0.07)
Basophils Absolute: 0.1 10*3/uL (ref 0.0–0.1)
Basophils Relative: 1 %
Eosinophils Absolute: 0.4 10*3/uL (ref 0.0–0.5)
Eosinophils Relative: 6 %
HCT: 38.9 % — ABNORMAL LOW (ref 39.0–52.0)
Hemoglobin: 12.9 g/dL — ABNORMAL LOW (ref 13.0–17.0)
Immature Granulocytes: 0 %
Lymphocytes Relative: 22 %
Lymphs Abs: 1.3 10*3/uL (ref 0.7–4.0)
MCH: 33 pg (ref 26.0–34.0)
MCHC: 33.2 g/dL (ref 30.0–36.0)
MCV: 99.5 fL (ref 80.0–100.0)
Monocytes Absolute: 0.4 10*3/uL (ref 0.1–1.0)
Monocytes Relative: 7 %
Neutro Abs: 3.9 10*3/uL (ref 1.7–7.7)
Neutrophils Relative %: 64 %
Platelets: 161 10*3/uL (ref 150–400)
RBC: 3.91 MIL/uL — ABNORMAL LOW (ref 4.22–5.81)
RDW: 12.7 % (ref 11.5–15.5)
WBC: 6.1 10*3/uL (ref 4.0–10.5)
nRBC: 0 % (ref 0.0–0.2)

## 2022-07-29 LAB — BASIC METABOLIC PANEL
Anion gap: 8 (ref 5–15)
BUN: 19 mg/dL (ref 8–23)
CO2: 27 mmol/L (ref 22–32)
Calcium: 9.5 mg/dL (ref 8.9–10.3)
Chloride: 104 mmol/L (ref 98–111)
Creatinine, Ser: 0.95 mg/dL (ref 0.61–1.24)
GFR, Estimated: >60 mL/min (ref >60)
Glucose, Bld: 100 mg/dL — ABNORMAL HIGH (ref 70–99)
Potassium: 4.6 mmol/L (ref 3.5–5.1)
Sodium: 139 mmol/L (ref 135–145)

## 2022-07-29 MED ORDER — CYCLOBENZAPRINE HCL 10 MG PO TABS
5.0000 mg | ORAL_TABLET | Freq: Once | ORAL | Status: AC
  Administered 2022-07-29: 5 mg via ORAL
  Filled 2022-07-29: qty 1

## 2022-07-29 MED ORDER — IBUPROFEN 400 MG PO TABS
600.0000 mg | ORAL_TABLET | Freq: Once | ORAL | Status: AC
  Administered 2022-07-29: 600 mg via ORAL
  Filled 2022-07-29: qty 1

## 2022-07-29 NOTE — ED Provider Triage Note (Addendum)
Emergency Medicine Provider Triage Evaluation Note  Max Ryan , a 76 y.o. male  was evaluated in triage.  Pt complains of headache and neck pain. Insidious onset. Started Friday while relaxing. Denies trauma. Located in back of right side of head and radiates down back right neck. No visual disturbance. Denies facial  droop, no changes in ROM and sensation of extremities. Walking normally. Took tramadol, oxycodone which helped sleep. Call PCP and had virtual visit and they advised to come here for potential head CT.   H/o cervical fusion in 2021  Review of Systems  Positive: See above Negative: See above  Physical Exam  BP 134/85   Pulse (!) 53   Temp 97.7 F (36.5 C) (Oral)   Resp 18   Ht '5\' 8"'$  (1.727 m)   Wt 97.5 kg   SpO2 98%   BMI 32.69 kg/m  Gen:   Awake, no distress   Resp:  Normal effort  MSK:   Moves extremities without difficulty  Other:  No FND on neuro exam  Medical Decision Making  Medically screening exam initiated at 2:46 PM.  Appropriate orders placed.  PRESTIN MUNCH was informed that the remainder of the evaluation will be completed by another provider, this initial triage assessment does not replace that evaluation, and the importance of remaining in the ED until their evaluation is complete.  Ibuprofen, flexiril, cbc, bmp   Harriet Pho, PA-C 07/29/22 1455    Harriet Pho, PA-C 07/29/22 1457

## 2022-07-29 NOTE — Patient Instructions (Signed)
Please go to the nearest ED to be seen

## 2022-07-29 NOTE — ED Notes (Signed)
Pt decided to leave...he no longer wanted to wait

## 2022-07-29 NOTE — Progress Notes (Signed)
[place]   New onset occipital headache- no history of migraines or headaches.  No vision changes, or change in previous weakness with arm.  Hx of cervical fusion as well   Recommendation based off symptoms is face to face ED for any possible need for Diagnostic test.   Patient acknowledged agreement and understanding of the plan.

## 2022-07-29 NOTE — Telephone Encounter (Signed)
Summary: headache   PT called wanting to come in today.  He has a headache in the back of his head for a couple of days.  No nausea or vomiting .  Please advise/  No appt until oct   CB@  [phone]      Chief Complaint: headache Symptoms: pin to back of neck and head Frequency: 3 days ago Pertinent Negatives: Patient denies fever, stiff neck, eye pain, sore throat, cold symptoms, numbness or weakness to face arms or legs. No vision disrbances Disposition: '[]'$ ED /'[]'$ Urgent Care (no appt availability in office) / '[]'$ Appointment(In office/virtual)/ '[]'$  [facility]/ '[]'$ Home Care/ '[]'$ Refused Recommended Disposition /'[]'$ [place] [facility]/ '[]'$  Follow-up with PCP Additional Notes: pt requesting to be seen today- no appt's in office. Scheduled VV in MyChart at 1100.  Reason for Disposition  [1] MODERATE headache (e.g., interferes with normal activities) AND [2] present > 24 hours AND [3] unexplained  (Exceptions: analgesics not tried, typical migraine, or headache part of viral illness)  Answer Assessment - Initial Assessment Questions 1. LOCATION: "Where does it hurt?"      Back of neck  2. ONSET: "When did the headache start?" (Minutes, hours or days)      Few days 3. PATTERN: "Does the pain come and go, or has it been constant since it started?"     constant 4. SEVERITY: "How bad is the pain?" and "What does it keep you from doing?"  (e.g., Scale 1-10; mild, moderate, or severe)   - MILD (1-3): doesn't interfere with normal activities    - MODERATE (4-7): interferes with normal activities or awakens from sleep    - SEVERE (8-10): excruciating pain, unable to do any normal activities        moderate 5. RECURRENT SYMPTOM: "Have you ever had headaches before?" If Yes, ask: "When was the last time?" and "What happened that time?"      no 6. CAUSE: "What do you think is causing the headache?"     No idea 7. MIGRAINE: "Have you been diagnosed with migraine headaches?" If Yes, ask:  "Is this headache similar?"      no 8. HEAD INJURY: "Has there been any recent injury to the head?"      no 9. OTHER SYMPTOMS: "Do you have any other symptoms?" (fever, stiff neck, eye pain, sore throat, cold symptoms)     no 10. PREGNANCY: "Is there any chance you are pregnant?" "When was your last menstrual period?"       N/a  Protocols used: Headache-A-AH

## 2022-07-29 NOTE — Telephone Encounter (Signed)
Requested medication (s) are due for refill today: es  Requested medication (s) are on the active medication list: {yes  Last refill:  unknown  Future visit scheduled:yes  Notes to clinic:  Unable to refill per protocol, last refill by another provider. Historical provider, routing for approval.     Requested Prescriptions  Pending Prescriptions Disp Refills   gabapentin (NEURONTIN) 600 MG tablet      Sig: Take 1 tablet (600 mg total) by mouth 2 (two) times daily.     Neurology: Anticonvulsants - gabapentin Failed - 07/28/2022 11:45 AM      Failed - Cr in normal range and within 360 days    Creatinine, Ser  Date Value Ref Range Status  03/14/2022 0.75 (L) 0.76 - 1.27 mg/dL Final         Passed - Completed PHQ-2 or PHQ-9 in the last 360 days      Passed - Valid encounter within last 12 months    Recent Outpatient Visits           2 weeks ago Essential (primary) hypertension   Primary Care at [facility] [place], Amy J, NP   3 months ago 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, unspecified cause   Primary Care at [facility], MD   4 months ago Essential (primary) hypertension   Primary Care at [facility] [place], NP   5 months ago    Primary Care at [facility], Amy J, NP   6 months ago Essential hypertension   Primary Care at [facility], Kriste Basque, NP       Future Appointments             Tomorrow Laurin Coder, MD [facility]   In 2 months Dorna Mai, MD Primary Care at [facility]

## 2022-07-29 NOTE — ED Notes (Signed)
Refusing VS

## 2022-07-29 NOTE — ED Notes (Signed)
Pt reports no change in pain. Pt updated on plan.

## 2022-07-29 NOTE — ED Triage Notes (Signed)
Pt reports he has been having pain in the back of his head and neck for the past 4-5 days. Denies any injury or other associated symptoms.

## 2022-07-30 ENCOUNTER — Ambulatory Visit: Payer: Medicare HMO | Admitting: Pulmonary Disease

## 2022-07-30 ENCOUNTER — Ambulatory Visit (INDEPENDENT_AMBULATORY_CARE_PROVIDER_SITE_OTHER): Payer: Medicare HMO | Admitting: Pulmonary Disease

## 2022-07-30 ENCOUNTER — Encounter: Payer: Self-pay | Admitting: Pulmonary Disease

## 2022-07-30 VITALS — BP 112/68 | HR 60 | Temp 97.7°F | Ht 67.0 in | Wt 221.0 lb

## 2022-07-30 DIAGNOSIS — D869 Sarcoidosis, unspecified: Secondary | ICD-10-CM

## 2022-07-30 DIAGNOSIS — R0609 Other forms of dyspnea: Secondary | ICD-10-CM

## 2022-07-30 LAB — PULMONARY FUNCTION TEST
DL/VA % pred: 88 %
DL/VA: 3.54 ml/min/mmHg/L
DLCO cor % pred: 83 %
DLCO cor: 19.69 ml/min/mmHg
DLCO unc % pred: 83 %
DLCO unc: 19.69 ml/min/mmHg
FEF 25-75 Post: 2.87 L/sec
FEF 25-75 Pre: 2.03 L/sec
FEF2575-%Change-Post: 41 %
FEF2575-%Pred-Post: 137 %
FEF2575-%Pred-Pre: 97 %
FEV1-%Change-Post: 8 %
FEV1-%Pred-Post: 98 %
FEV1-%Pred-Pre: 90 %
FEV1-Post: 2.79 L
FEV1-Pre: 2.57 L
FEV1FVC-%Change-Post: 5 %
FEV1FVC-%Pred-Pre: 104 %
FEV6-%Change-Post: 2 %
FEV6-%Pred-Post: 94 %
FEV6-%Pred-Pre: 91 %
FEV6-Post: 3.46 L
FEV6-Pre: 3.37 L
FEV6FVC-%Pred-Post: 106 %
FEV6FVC-%Pred-Pre: 106 %
FVC-%Change-Post: 2 %
FVC-%Pred-Post: 88 %
FVC-%Pred-Pre: 85 %
FVC-Post: 3.46 L
FVC-Pre: 3.37 L
Post FEV1/FVC ratio: 81 %
Post FEV6/FVC ratio: 100 %
Pre FEV1/FVC ratio: 76 %
Pre FEV6/FVC Ratio: 100 %
RV % pred: 124 %
RV: 3.01 L
TLC % pred: 96 %
TLC: 6.36 L

## 2022-07-30 NOTE — Progress Notes (Signed)
Max Ryan    [id number]    [birth year]  Primary Care Physician:Wilson, Amy A, NP  Referring Physician: Clearence Cheek, NP No address on file  Chief complaint:   Patient being evaluated for shortness of breath on exertion History of sarcoidosis diagnosed in 2007 In for follow-up today  HPI:  Sarcoidosis was diagnosed when he was living in [place] was told at the time that in a good number of patients, it burned itself out He did not receive any treatment for it back then and has not had any problems until recently when he started getting concerned because of shortness of breath  Had a CT scan which showed calcified adenopathy Had a PFT today-reviewed with the patient showing normal findings  He continues to feel well, stays active Exercises regularly Able to walk about 3 miles on a regular basis  He did smoke in the past, quit in 2007, about a pack a day  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 Swallow whole.   atorvastatin (LIPITOR) 40 MG tablet Take 1 tablet (40 mg total) by mouth daily.   carbidopa-levodopa (SINEMET IR) 25-100 MG tablet Take 1 tablet by mouth at bedtime.   colchicine 0.6 MG tablet TAKE 1 TABLET BY MOUTH EVERY DAY   gabapentin (NEURONTIN) 600 MG tablet Take 600 mg by mouth 2 (two) times daily.   losartan (COZAAR) 25 MG tablet Take 1 tablet (25 mg total) by mouth in the morning.   meloxicam (MOBIC) 15 MG tablet Take  15 mg by mouth daily.   metFORMIN (GLUCOPHAGE) 500 MG tablet TAKE 1 TABLET BY MOUTH TWICE A DAY   tamsulosin (FLOMAX) 0.4 MG CAPS capsule Take 0.4 mg by mouth daily.   traMADol (ULTRAM) 50 MG tablet Take 1 tablet (50 mg total) by mouth every 4 (four) hours as needed.   traZODone (DESYREL) 100 MG tablet Take 100 mg by mouth at bedtime.   No facility-administered encounter medications on file as of 07/30/2022.    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    Past Surgical History:  Procedure Laterality Date   ANTERIOR CERVICAL DECOMPRESSION/DISCECTOMY FUSION 4 LEVELS N/A 08/17/2019   Procedure: ANTERIOR CERVICAL DECOMPRESSION FUSION CERVICAL THREE-FOUR, CERVICAL FOUR-FIVE, CERVICAL FIVE-SIX, CERVICAL SIX-SEVEN WITH INSTRUMENTATION AND ALLOGRAFT;  Surgeon: Phylliss Bob, MD;  Location: [place];  Service: Orthopedics;  Laterality: N/A;   CARPAL TUNNEL RELEASE Right    CHOLECYSTECTOMY     COLONOSCOPY     CYSTOSCOPY/URETEROSCOPY/HOLMIUM LASER/STENT PLACEMENT Right 07/19/2019  Procedure: CYSTOSCOPY/URETEROSCOPY/HOLMIUM LASER/STENT PLACEMENT;  Surgeon: Ardis Hughs, MD;  Location: [facility];  Service: Urology;  Laterality: Right;   HERNIA REPAIR     JOINT REPLACEMENT Left    knee   KNEE SURGERY Left    KYPHOPLASTY N/A 11/06/2021   Procedure: THORACIC EIGHT, THORACIC NINE KHYPHOPLASTY;  Surgeon: Phylliss Bob, MD;  Location: [place];  Service: Orthopedics;  Laterality: N/A;   LAPAROSCOPIC ROUX-EN-Y GASTRIC BYPASS WITH UPPER ENDOSCOPY AND REMOVAL OF LAP BAND     POSTERIOR CERVICAL FUSION/FORAMINOTOMY N/A 04/26/2020   Procedure: POSTERIOR CERVICAL DECOMPRESSION CERVICAL SIX- THORACIC ONE;   Surgeon: Phylliss Bob, MD;  Location: [place];  Service: Orthopedics;  Laterality: N/A;   SPINAL FUSION  2004   TONSILLECTOMY      Family History  Problem Relation Age of Onset   CAD Father     Social History   Socioeconomic History   Marital status: Married    Spouse name: Not on file   Number of children: Not on file   Years of education: Not on file   Highest education level: Not on file  Occupational History   Not on file  Tobacco Use   Smoking status: Former    Types: Cigarettes, E-cigarettes    Quit date: 2005    Years since quitting: 18.7   Smokeless tobacco: Never  Vaping Use   Vaping Use: Never used  Substance and Sexual Activity   Alcohol use: Not Currently    Alcohol/week: 42.0 standard drinks of alcohol    Types: 42 Glasses of wine per week    Comment: "Haven't had a drink since 9/1 2022" - per pt   Drug use: Yes    Types: Marijuana    Comment: occ   Sexual activity: Not on file  Other Topics Concern   Not on file  Social History Narrative   Not on file   Social Determinants of Health   Financial Resource Strain: Not on file  Food Insecurity: Not on file  Transportation Needs: Not on file  Physical Activity: Not on file  Stress: Not on file  Social Connections: Not on file  Intimate Partner Violence: Not on file    Review of Systems  Constitutional:  Negative for fatigue.  Respiratory:  Negative for cough.     Vitals:   07/30/22 1100  BP: 112/68  Pulse: 60  Temp: 97.7 F (36.5 C)  SpO2: 97%     Physical Exam Constitutional:      Appearance: He is obese.  HENT:     Head: Normocephalic.     Mouth/Throat:     Mouth: Mucous membranes are moist.     Pharynx: No oropharyngeal exudate or posterior oropharyngeal erythema.  Cardiovascular:     Rate and Rhythm: Normal rate and regular rhythm.     Heart sounds: No murmur heard.    No friction rub.  Pulmonary:     Effort: No respiratory distress.     Breath sounds: No stridor. No  wheezing or rhonchi.  Musculoskeletal:     Cervical back: No rigidity or tenderness.  Neurological:     Mental Status: He is alert.  Psychiatric:        Mood and Affect: Mood normal.      Data Reviewed: Cardiac CT was reviewed with the patient-the CT does show calcified hilar and mediastinal nodes-no significant evidence of scarring in the lower lobes of the lungs  PFT reviewed with the patient today showing no obstruction, no significant  bronchodilator response, no restriction, normal diffusing capacity  Assessment:  History of sarcoidosis -Does not appear to be active at present  Shortness of breath on exertion -Better -Continues to stay active able to walk up to 3 miles  Significant history of back pain -Stable  No history of cardiac arrhythmia, no skin lesions, no chest pressure or chest discomfort, no other symptoms of arthritis in other joints, no significant history of eye disease    Plan/Recommendations:  Routine follow-up in a year  Does not require repeat CT or CT scan at present  Encouraged to give [place] a call if he has any significant concerns  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

## 2022-07-30 NOTE — Progress Notes (Signed)
Full PFT performed today. °

## 2022-07-30 NOTE — Patient Instructions (Signed)
Full PFT performed today. °

## 2022-08-01 ENCOUNTER — Other Ambulatory Visit: Payer: Self-pay

## 2022-08-01 ENCOUNTER — Ambulatory Visit
Admission: EM | Admit: 2022-08-01 | Discharge: 2022-08-01 | Disposition: A | Discharge status: Home / Self Care | Payer: Medicare HMO | Attending: Internal Medicine | Admitting: Internal Medicine

## 2022-08-01 ENCOUNTER — Emergency Department (HOSPITAL_BASED_OUTPATIENT_CLINIC_OR_DEPARTMENT_OTHER): Payer: Medicare HMO

## 2022-08-01 ENCOUNTER — Emergency Department (HOSPITAL_BASED_OUTPATIENT_CLINIC_OR_DEPARTMENT_OTHER)
Admission: EM | Admit: 2022-08-01 | Discharge: 2022-08-01 | Disposition: A | Discharge status: Home / Self Care | Payer: Medicare HMO | Attending: Emergency Medicine | Admitting: Emergency Medicine

## 2022-08-01 ENCOUNTER — Encounter (HOSPITAL_BASED_OUTPATIENT_CLINIC_OR_DEPARTMENT_OTHER): Payer: Self-pay

## 2022-08-01 DIAGNOSIS — M5090 Cervical disc disorder, unspecified, unspecified cervical region: Secondary | ICD-10-CM | POA: Insufficient documentation

## 2022-08-01 DIAGNOSIS — M542 Cervicalgia: Secondary | ICD-10-CM

## 2022-08-01 DIAGNOSIS — Z7982 Long term (current) use of aspirin: Secondary | ICD-10-CM | POA: Insufficient documentation

## 2022-08-01 DIAGNOSIS — R519 Headache, unspecified: Secondary | ICD-10-CM

## 2022-08-01 DIAGNOSIS — M489 Spondylopathy, unspecified: Secondary | ICD-10-CM

## 2022-08-01 MED ORDER — TRAMADOL HCL 50 MG PO TABS: 50.0000 mg | ORAL_TABLET | Freq: Four times a day (QID) | ORAL | 0 refills | Status: AC | PRN

## 2022-08-01 MED ORDER — METHOCARBAMOL 500 MG PO TABS: 500.0000 mg | ORAL_TABLET | Freq: Two times a day (BID) | ORAL | 0 refills | Status: AC | PRN

## 2022-08-01 NOTE — ED Triage Notes (Signed)
Pt states having neck pain for 8 days. Pain 4/10. States has had fusion in neck 2021. States had consultation over phone and was urged to go get a scan. States having pain on right side radiating down arm.

## 2022-08-01 NOTE — ED Provider Notes (Signed)
Max Ryan    CSN: [id number] Arrival date & time: 08/01/22  0825      History   Chief Complaint Chief Complaint  Patient presents with   Neck Pain    HPI Max Ryan is a 76 y.o. male.   Patient presents with right-sided neck/head pain that has been present for about 1 week.  Patient reports that it starts in the occipital portion of the right side of the head and radiates down right neck.  Denies any obvious injury.  Patient reports that pain started when he was sitting still watching TV.  Patient rates his pain currently 5/10 on pain scale.  Movement exacerbates pain.  Patient reports history of cervical fusion in 2021 but states that this pain feels different as he typically has had midline cervical pain in the past that radiates down left arm.  Although, patient does report that has been having some numbness and tingling down right arm as well.  He has appointment with spinal specialist on 08/13/2022 for further evaluation.  Patient has been taking leftover tramadol at night to help him sleep with temporary improvement.  Denies any recent falls or injuries.  Patient went to the ED on 07/29/2022 as he had a E-visit with his PCP who advised him to get a CT scan of the head given new onset headache.  Patient left before being discharged and did not have any scans completed.  Patient denies dizziness, blurred vision, nausea, vomiting, any headache in any other part of the head.   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    Patient Active Problem List   Diagnosis Date Noted   Body mass index (BMI) 33.0-33.9, adult 04/02/2022   Hyperglycemia due to type 2 diabetes mellitus ([place]) 04/02/2022   Iron deficiency  anemia 04/02/2022   Low back pain 04/02/2022   Pure hypercholesterolemia 04/02/2022   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   Vitamin D deficiency 04/02/2022   Compression fracture of body of thoracic vertebra ([place]) 11/06/2021   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   Mixed hyperlipidemia 04/30/2021   Type 2 diabetes mellitus treated without insulin ([place]) 04/30/2021   Morbid obesity ([place]) 04/30/2021   Hepatic steatosis 04/30/2021   Coronary artery calcification 04/30/2021   Essential hypertension 04/30/2021   Myeloradiculopathy 08/17/2019   Primary osteoarthritis of left knee 01/29/2018   Primary hypertension 06/30/2017   Displacement of lumbar intervertebral disc without myelopathy 07/24/2015   Lumbosacral spondylosis without myelopathy 07/04/2015    Past Surgical History:  Procedure Laterality Date   ANTERIOR CERVICAL DECOMPRESSION/DISCECTOMY FUSION 4 LEVELS N/A 08/17/2019   Procedure: ANTERIOR CERVICAL DECOMPRESSION FUSION CERVICAL THREE-FOUR, CERVICAL FOUR-FIVE, CERVICAL FIVE-SIX, CERVICAL SIX-SEVEN WITH INSTRUMENTATION AND ALLOGRAFT;  Surgeon: Phylliss Bob, MD;  Location: [facility];  Service: Orthopedics;  Laterality: N/A;   CARPAL TUNNEL RELEASE Right    CHOLECYSTECTOMY     COLONOSCOPY     CYSTOSCOPY/URETEROSCOPY/HOLMIUM LASER/STENT PLACEMENT Right 07/19/2019   Procedure: CYSTOSCOPY/URETEROSCOPY/HOLMIUM LASER/STENT PLACEMENT;  Surgeon: Ardis Hughs, MD;  Location: [facility];  Service: Urology;  Laterality: Right;   HERNIA REPAIR     JOINT REPLACEMENT Left    knee  KNEE SURGERY Left    KYPHOPLASTY N/A 11/06/2021   Procedure: THORACIC EIGHT, THORACIC NINE KHYPHOPLASTY;  Surgeon: Phylliss Bob, MD;  Location: [facility];  Service: Orthopedics;  Laterality: N/A;   LAPAROSCOPIC ROUX-EN-Y GASTRIC BYPASS WITH UPPER ENDOSCOPY AND REMOVAL OF LAP BAND     POSTERIOR CERVICAL FUSION/FORAMINOTOMY N/A 04/26/2020   Procedure: POSTERIOR CERVICAL DECOMPRESSION CERVICAL SIX- THORACIC ONE;   Surgeon: Phylliss Bob, MD;  Location: [facility];  Service: Orthopedics;  Laterality: N/A;   SPINAL FUSION  2004   TONSILLECTOMY         Home Medications    Prior to Admission medications   Medication Sig Start Date End Date Taking? Authorizing Provider  methocarbamol (ROBAXIN) 500 MG tablet Take 1 tablet (500 mg total) by mouth 2 (two) times daily as needed for muscle spasms. 08/01/22  Yes Oswaldo Conroy E, FNP  aspirin EC 81 MG tablet Take 81 mg by mouth daily. Swallow whole.    [provider]  atorvastatin (LIPITOR) 40 MG tablet Take 1 tablet (40 mg total) by mouth daily. 07/15/22 10/13/22  Camillia Herter, NP  carbidopa-levodopa (SINEMET IR) 25-100 MG tablet Take 1 tablet by mouth at bedtime. 07/03/19   [provider]  colchicine 0.6 MG tablet TAKE 1 TABLET BY MOUTH EVERY DAY 07/29/22   Dorna Mai, MD  gabapentin (NEURONTIN) 600 MG tablet Take 600 mg by mouth 2 (two) times daily.    [provider]  losartan (COZAAR) 25 MG tablet Take 1 tablet (25 mg total) by mouth in the morning. 07/15/22 10/13/22  Camillia Herter, NP  meloxicam (MOBIC) 15 MG tablet Take 15 mg by mouth daily. 07/08/22   [provider]  metFORMIN (GLUCOPHAGE) 500 MG tablet TAKE 1 TABLET BY MOUTH TWICE A DAY 07/09/22   Dorna Mai, MD  tamsulosin (FLOMAX) 0.4 MG CAPS capsule Take 0.4 mg by mouth daily. 05/02/22   [provider]  traMADol (ULTRAM) 50 MG tablet Take 1 tablet (50 mg total) by mouth every 4 (four) hours as needed. 11/06/21   Phylliss Bob, MD  traZODone (DESYREL) 100 MG tablet Take 100 mg by mouth at bedtime. 09/01/21   [provider]    Family History Family History  Problem Relation Age of Onset   CAD Father     Social History Social History   Tobacco Use   Smoking status: Former    Types: Cigarettes, E-cigarettes    Quit date: 2005    Years since quitting: 18.7   Smokeless tobacco: Never  Vaping Use   Vaping Use: Never used  Substance Use  Topics   Alcohol use: Not Currently    Alcohol/week: 42.0 standard drinks of alcohol    Types: 42 Glasses of wine per week    Comment: "Haven't had a drink since 9/1 2022" - per pt   Drug use: Yes    Types: Marijuana    Comment: occ     Allergies   Patient has no known allergies.   Review of Systems Review of Systems Per HPI  Physical Exam Triage Vital Signs ED Triage Vitals  Enc Vitals Group     BP 08/01/22 0835 (!) 148/86     Pulse Rate 08/01/22 0835 (!) 59     Resp 08/01/22 0835 18     Temp 08/01/22 0835 97.6 F (36.4 C)     Temp src --      SpO2 08/01/22 0835 95 %     Weight --  Height --      Head Circumference --      Peak Flow --      Pain Score 08/01/22 0837 4     Pain Loc --      Pain Edu? --      Excl. in [place]? --    No data found.  Updated Vital Signs BP (!) 148/86   Pulse (!) 59   Temp 97.6 F (36.4 C)   Resp 18   SpO2 95%   Visual Acuity Right Eye Distance:   Left Eye Distance:   Bilateral Distance:    Right Eye Near:   Left Eye Near:    Bilateral Near:     Physical Exam Constitutional:      General: He is not in acute distress.    Appearance: Normal appearance. He is not toxic-appearing or diaphoretic.  HENT:     Head: Normocephalic and atraumatic.  Eyes:     Extraocular Movements: Extraocular movements intact.     Conjunctiva/sclera: Conjunctivae normal.  Neck:      Comments: There is no tenderness to palpation on exam.  No direct spinal tenderness, crepitus, step-off noted.  Patient reports movement with rotation of neck.  No obvious swelling or discoloration noted. Pulmonary:     Effort: Pulmonary effort is normal.  Musculoskeletal:     Cervical back: Normal range of motion. No edema or erythema. Pain with movement present. No spinous process tenderness or muscular tenderness.  Lymphadenopathy:     Cervical: No cervical adenopathy.  Neurological:     General: No focal deficit present.     Mental Status: He is alert and  oriented to person, place, and time. Mental status is at baseline.     Cranial Nerves: Cranial nerves 2-12 are intact.     Sensory: Sensation is intact.     Motor: Motor function is intact.     Coordination: Coordination is intact.     Gait: Gait is intact.  Psychiatric:        Mood and Affect: Mood normal.        Behavior: Behavior normal.        Thought Content: Thought content normal.        Judgment: Judgment normal.      UC Treatments / Results  Labs (all labs ordered are listed, but only abnormal results are displayed) Labs Reviewed - No data to display  EKG   Radiology No results found.  Procedures Procedures (including critical Ryan time)  Medications Ordered in UC Medications - No data to display  Initial Impression / Assessment and Plan / UC Course  I have reviewed the triage vital signs and the nursing notes.  Pertinent labs & imaging results that were available during my Ryan of the patient were reviewed by me and considered in my medical decision making (see chart for details).     Given new onset occipital headache, patient was advised to go to the emergency department again for further evaluation and management as CT imaging is most likely necessary.  Patient declined going to the ER.  Risks associated with not going to the ER were discussed with patient.  Patient voiced understanding.  Therefore, will do conservative and limited treatment/evaluation here in urgent Ryan.  Do not think imaging of the neck with x-ray is necessary given that there is no direct spinal tenderness or obvious injury.  I do think CT imaging would be most definitive of the head and neck.  Symptoms could  be musculoskeletal in nature but it is hard to determine given that pain is not reproducible with palpation.  Although, patient does report that pain is exacerbated by movement.  Will trial muscle relaxer.  Patient advised to not take muscle relaxer with tramadol or trazodone as it can  cause excessive drowsiness. Patient voiced understanding. Discussed alternating ice and heat to affected area as well.  Patient was advised to go to the emergency department if symptoms persist or worsen.  Advised patient to keep scheduled appointment with spine specialist on 08/13/2022.  Patient verbalized understanding and was agreeable with plan.  Although, when patient was being discharged by clinical staff, he reported that he would like to go to the emergency department after all. Final Clinical Impressions(s) / UC Diagnoses   Final diagnoses:  Occipital headache  Neck pain on right side     Discharge Instructions      You have been prescribed a muscle relaxer.  Please do not take with any other sedating medications including tramadol or trazodone.  Please be advised that muscle relaxer can make you drowsy so do not drive or drink alcohol while taking this medication.  Please go to the emergency department if symptoms persist or worsen.  Keep follow-up appointment with orthopedic specialist as well.    ED Prescriptions     Medication Sig Dispense Auth. Provider   methocarbamol (ROBAXIN) 500 MG tablet Take 1 tablet (500 mg total) by mouth 2 (two) times daily as needed for muscle spasms. 20 tablet [place], Michele Rockers, [place]      PDMP not reviewed this encounter.   Teodora Medici, [place] 08/01/22 [phone]

## 2022-08-01 NOTE — Discharge Instructions (Signed)
You have been prescribed a muscle relaxer.  Please do not take with any other sedating medications including tramadol or trazodone.  Please be advised that muscle relaxer can make you drowsy so do not drive or drink alcohol while taking this medication.  Please go to the emergency department if symptoms persist or worsen.  Keep follow-up appointment with orthopedic specialist as well.

## 2022-08-01 NOTE — Discharge Instructions (Signed)
CT scan did not show any acute abnormality.  Take the medications as needed for pain and discomfort.  You can also try taking the muscle relaxant that was prescribed.  Follow-up with your doctor for further evaluation if the symptoms persist

## 2022-08-01 NOTE — ED Provider Notes (Signed)
[facility] EMERGENCY DEPT Provider Note   CSN: [id number] Arrival date & time: 08/01/22  0935     History  Chief Complaint  Patient presents with   Neck Pain    Max Ryan is a 76 y.o. male.   Neck Pain    Patient reports having pain in the back of his head ongoing for about the last 8 days.  Pain has been about a 4 out of 10.  Patient states the pain stays right there.  It is not radiating.  He has not had fevers or chills.  No nausea or vomiting.  Patient initially had a video visit with a provider.  They recommended a head CT as the patient does not have regular headaches.  He tried to go to [facility] emergency room for that test 3 days ago.  The wait time was prolonged to the patient left before getting a full evaluation.  He went to an urgent care this morning.  They had him come to the ED to get a CT scan since that had been recommended by his provider.   Patient denies any falls or injuries.  He does have history of cervical spine disease and had prior surgery.  He does have some persistent weakness in his left arm from that.  He has been having issues with some numbness in his right arm and is due to have nerve conduction studies.   Home Medications Prior to Admission medications   Medication Sig Start Date End Date Taking? Authorizing Provider  traMADol (ULTRAM) 50 MG tablet Take 1 tablet (50 mg total) by mouth every 6 (six) hours as needed for up to 5 days. 08/01/22 08/06/22 Yes Dorie Rank, MD  aspirin EC 81 MG tablet Take 81 mg by mouth daily. Swallow whole.    [provider]  atorvastatin (LIPITOR) 40 MG tablet Take 1 tablet (40 mg total) by mouth daily. 07/15/22 10/13/22  Camillia Herter, NP  carbidopa-levodopa (SINEMET IR) 25-100 MG tablet Take 1 tablet by mouth at bedtime. 07/03/19   [provider]  colchicine 0.6 MG tablet TAKE 1 TABLET BY MOUTH EVERY DAY 07/29/22   Dorna Mai, MD  gabapentin (NEURONTIN) 600 MG tablet Take 600 mg  by mouth 2 (two) times daily.    [provider]  losartan (COZAAR) 25 MG tablet Take 1 tablet (25 mg total) by mouth in the morning. 07/15/22 10/13/22  Camillia Herter, NP  meloxicam (MOBIC) 15 MG tablet Take 15 mg by mouth daily. 07/08/22   [provider]  metFORMIN (GLUCOPHAGE) 500 MG tablet TAKE 1 TABLET BY MOUTH TWICE A DAY 07/09/22   Dorna Mai, MD  methocarbamol (ROBAXIN) 500 MG tablet Take 1 tablet (500 mg total) by mouth 2 (two) times daily as needed for muscle spasms. 08/01/22   Teodora Medici, FNP  tamsulosin (FLOMAX) 0.4 MG CAPS capsule Take 0.4 mg by mouth daily. 05/02/22   [provider]  traZODone (DESYREL) 100 MG tablet Take 100 mg by mouth at bedtime. 09/01/21   [provider]      Allergies    Patient has no known allergies.    Review of Systems   Review of Systems  Musculoskeletal:  Positive for neck pain.    Physical Exam Updated Vital Signs BP (!) 173/94 (BP Location: Left Arm)   Pulse (!) 59   Temp 98.4 F (36.9 C) (Oral)   Resp 20   Ht 1.727 m ('5\' 8"'$ )   Wt  97.5 kg   SpO2 99%   BMI 32.69 kg/m  Physical Exam Vitals and nursing note reviewed.  Constitutional:      General: He is not in acute distress.    Appearance: He is well-developed.  HENT:     Head: Normocephalic and atraumatic.     Right Ear: External ear normal.     Left Ear: External ear normal.  Eyes:     General: No scleral icterus.       Right eye: No discharge.        Left eye: No discharge.     Conjunctiva/sclera: Conjunctivae normal.  Neck:     Trachea: No tracheal deviation.  Cardiovascular:     Rate and Rhythm: Normal rate.  Pulmonary:     Effort: Pulmonary effort is normal. No respiratory distress.     Breath sounds: No stridor.  Abdominal:     General: There is no distension.  Musculoskeletal:        General: No swelling or deformity.     Cervical back: Neck supple. No rigidity or tenderness.  Skin:    General: Skin is warm and dry.      Findings: No rash.  Neurological:     Mental Status: He is alert. Mental status is at baseline.     Cranial Nerves: Cranial nerve deficit: no gross deficits.     Comments: Slight decreased grip strength on the left side compared to the right, patient states this is unchanged.  Sensation intact in all 4 extremities, normal leg strength bilaterally,     ED Results / Procedures / Treatments   Labs (all labs ordered are listed, but only abnormal results are displayed) Labs Reviewed - No data to display  EKG None  Radiology CT CERVICAL SPINE WO CONTRAST  Result Date: 08/01/2022 CLINICAL DATA:  head and neck pain, prior c spine surgery EXAM: CT CERVICAL SPINE WITHOUT CONTRAST TECHNIQUE: Multidetector CT imaging of the cervical spine was performed without intravenous contrast. Multiplanar CT image reconstructions were also generated. RADIATION DOSE REDUCTION: This exam was performed according to the departmental dose-optimization program which includes automated exposure control, adjustment of the mA and/or kV according to patient size and/or use of iterative reconstruction technique. COMPARISON:  Plain films 04/26/2020 FINDINGS: Alignment: Normal Skull base and vertebrae: No acute fracture. No primary bone lesion or focal pathologic process. Soft tissues and spinal canal: No prevertebral fluid or swelling. No visible canal hematoma. Disc levels: Prior anterior fusion from C3-C7. Posterior spurring throughout the cervical spine, most notable in the lower cervical spine. Advanced diffuse degenerative facet disease bilaterally. Bilateral multilevel neural foraminal narrowing. Upper chest: No acute findings Other: None IMPRESSION: Postoperative and degenerative changes as above. No acute bony abnormality. Electronically Signed   By: Rolm Baptise M.D.   On: 08/01/2022 10:46   CT Head Wo Contrast  Result Date: 08/01/2022 CLINICAL DATA:  Headache, new or worsening (Age >= 50y) EXAM: CT HEAD WITHOUT  CONTRAST TECHNIQUE: Contiguous axial images were obtained from the base of the skull through the vertex without intravenous contrast. RADIATION DOSE REDUCTION: This exam was performed according to the departmental dose-optimization program which includes automated exposure control, adjustment of the mA and/or kV according to patient size and/or use of iterative reconstruction technique. COMPARISON:  None Available. FINDINGS: Brain: No acute intracranial abnormality. Specifically, no hemorrhage, hydrocephalus, mass lesion, acute infarction, or significant intracranial injury. Vascular: No hyperdense vessel or unexpected calcification. Skull: No acute calvarial abnormality. Sinuses/Orbits: No acute findings Other: None  IMPRESSION: Normal study. Electronically Signed   By: Rolm Baptise M.D.   On: 08/01/2022 10:44    Procedures Procedures    Medications Ordered in ED Medications - No data to display  ED Course/ Medical Decision Making/ A&P Clinical Course as of 08/01/22 1124  Fri Aug 01, 2022  1037 Head CT and C-spine CT was ordered.  Radiology and I recommended a C-spine CT [JK]    Clinical Course User Index [JK] Dorie Rank, MD                           Medical Decision Making Differential diagnosis includes but not limited to cervical spine disease, cerebral hemorrhage, cervicogenic headache  Amount and/or Complexity of Data Reviewed Radiology: ordered and independent interpretation performed.  Risk Prescription drug management. Risk Details: Patient's symptoms are not suggestive of a meningitis or subarachnoid hemorrhage.  He did not have any thunderclap onset.  Patient has seen 2 providers that recommended CT scan of the head.  He was sent to the ED specifically for this.  CT scan was performed and there is no acute abnormality noted.  Patient also had a CT scan of the cervical spine although I initially ordered just plain films.  There is no acute findings noted on the cervical spine  imaging.  Suspect his headache could be related to his arthritis in his neck.  Will discharge home with a course of medications for pain.  Patient did request tramadol.  PDMP reviewed.   Evaluation and diagnostic testing in the emergency department does not suggest an emergent condition requiring admission or immediate intervention beyond what has been performed at this time.  The patient is safe for discharge and has been instructed to return immediately for worsening symptoms, change in symptoms or any other concerns.        Final Clinical Impression(s) / ED Diagnoses Final diagnoses:  Nonintractable headache, unspecified chronicity pattern, unspecified headache type  Cervical spine disease    Rx / DC Orders ED Discharge Orders          Ordered    traMADol (ULTRAM) 50 MG tablet  Every 6 hours PRN        08/01/22 1123              Dorie Rank, MD 08/01/22 1126

## 2022-08-01 NOTE — ED Triage Notes (Signed)
Pt presents to uc with co of neck pain R side for 8 days. Pt reports pain is currently a 4/10 and has been taking tylenol. Pmh of spinal fusion and is to see spinal specialist 10/4. Pt has been taking tramadol at night for the pain.

## 2022-08-05 NOTE — Telephone Encounter (Signed)
Pt called to f/u on refill for gabapentin (NEURONTIN) 600 MG tablet / pt has enough for tomorrow and will then be out / please advise and send to [facility] on [address]

## 2022-08-07 ENCOUNTER — Other Ambulatory Visit: Payer: Self-pay | Admitting: Family Medicine

## 2022-08-07 MED ORDER — GABAPENTIN 600 MG PO TABS: 600.0000 mg | ORAL_TABLET | Freq: Two times a day (BID) | ORAL | 1 refills | Status: DC

## 2022-08-07 NOTE — Telephone Encounter (Signed)
Medication Refill - Medication: gabapentin (NEURONTIN) 600 MG tab Pt stated he needs to hear back about this refill today / he stated he has called and requested twice / please advise   Has the patient contacted their pharmacy? Yes.   (Agent: If no, request that the patient contact the pharmacy for the refill. If patient does not wish to contact the pharmacy document the reason why and proceed with request.) (Agent: If yes, when and what did the pharmacy advise?)  Preferred Pharmacy (with phone number or street name): [id number] [place] Has the patient been seen for an appointment in the last year OR does the patient have an upcoming appointment? Yes.    Agent: Please be advised that RX refills may take up to 3 business days. We ask that you follow-up with your pharmacy.

## 2022-08-07 NOTE — Telephone Encounter (Signed)
Requested medication (s) are due for refill today: yes  Requested medication (s) are on the active medication list: yes  Last refill:  unsure  Future visit scheduled: yes  Notes to clinic:  Unable to refill per protocol, last refill by another provider. Historical provider, routing for review, 2nd request, pt is inquiring about refill.     Requested Prescriptions  Pending Prescriptions Disp Refills   gabapentin (NEURONTIN) 600 MG tablet      Sig: Take 1 tablet (600 mg total) by mouth 2 (two) times daily.     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

## 2022-08-21 ENCOUNTER — Ambulatory Visit: Payer: Medicare HMO | Admitting: Podiatry

## 2022-08-21 DIAGNOSIS — B351 Tinea unguium: Secondary | ICD-10-CM | POA: Diagnosis not present

## 2022-08-21 DIAGNOSIS — M79675 Pain in left toe(s): Secondary | ICD-10-CM

## 2022-08-21 DIAGNOSIS — E119 Type 2 diabetes mellitus without complications: Secondary | ICD-10-CM | POA: Diagnosis not present

## 2022-08-21 DIAGNOSIS — M79674 Pain in right toe(s): Secondary | ICD-10-CM | POA: Diagnosis not present

## 2022-08-24 NOTE — Progress Notes (Signed)
  Subjective:  Patient ID: Max Ryan, male    DOB: 06-Jan-1946,  MRN: [id number]  Chief Complaint  Patient presents with   Diabetes    New Patient, diabetic foot care   Nail Problem    Thick painful toenails, 3 month follow up     76 y.o. male presents with the above complaint. History confirmed with patient.  Says his blood sugar is well controlled  Objective:  Physical Exam: warm, good capillary refill, no trophic changes or ulcerative lesions, normal DP and PT pulses, and normal sensory exam. Left Foot: dystrophic yellowed discolored nail plates with subungual debris Right Foot: dystrophic yellowed discolored nail plates with subungual debris   Assessment:   1. Pain due to onychomycosis of toenails of both feet   2. Type 2 diabetes mellitus treated without insulin ([place])      Plan:  Patient was evaluated and treated and all questions answered.   Patient educated on diabetes. Discussed proper diabetic foot care and discussed risks and complications of disease. Educated patient in depth on reasons to return to the office immediately should he/she discover anything concerning or new on the feet. All questions answered. Discussed proper shoes as well.      Recommended debridement of the nails today. Sharp and mechanical debridement performed of all painful and mycotic nails today. Nails debrided in length and thickness using a nail nipper to level of comfort. Discussed treatment options including appropriate shoe gear. Follow up as needed for painful nails.    Return in about 1 year (around 08/22/2023) for at risk diabetic exam.

## 2022-09-29 ENCOUNTER — Other Ambulatory Visit: Payer: Self-pay | Admitting: *Deleted

## 2022-09-30 ENCOUNTER — Other Ambulatory Visit: Payer: Self-pay | Admitting: Family Medicine

## 2022-09-30 DIAGNOSIS — E119 Type 2 diabetes mellitus without complications: Secondary | ICD-10-CM

## 2022-10-14 ENCOUNTER — Ambulatory Visit (INDEPENDENT_AMBULATORY_CARE_PROVIDER_SITE_OTHER): Payer: Medicare HMO | Admitting: Family Medicine

## 2022-10-14 ENCOUNTER — Encounter: Payer: Self-pay | Admitting: Family Medicine

## 2022-10-14 VITALS — BP 110/74 | HR 70 | Temp 96.6°F | Resp 16 | Wt 232.0 lb

## 2022-10-14 DIAGNOSIS — E119 Type 2 diabetes mellitus without complications: Secondary | ICD-10-CM

## 2022-10-14 DIAGNOSIS — G4700 Insomnia, unspecified: Secondary | ICD-10-CM

## 2022-10-14 DIAGNOSIS — Z0001 Encounter for general adult medical examination with abnormal findings: Secondary | ICD-10-CM

## 2022-10-14 DIAGNOSIS — I10 Essential (primary) hypertension: Secondary | ICD-10-CM | POA: Diagnosis not present

## 2022-10-14 DIAGNOSIS — G2581 Restless legs syndrome: Secondary | ICD-10-CM

## 2022-10-14 DIAGNOSIS — E782 Mixed hyperlipidemia: Secondary | ICD-10-CM | POA: Diagnosis not present

## 2022-10-14 DIAGNOSIS — Z0000 Encounter for general adult medical examination without abnormal findings: Secondary | ICD-10-CM

## 2022-10-14 LAB — POCT GLYCOSYLATED HEMOGLOBIN (HGB A1C): Hemoglobin A1C: 6.3 % — AB (ref 4.0–5.6)

## 2022-10-14 MED ORDER — CARBIDOPA-LEVODOPA 25-100 MG PO TABS: 1.0000 | ORAL_TABLET | Freq: Every day | ORAL | 1 refills | Status: DC

## 2022-10-14 MED ORDER — TRAZODONE HCL 100 MG PO TABS: 100.0000 mg | ORAL_TABLET | Freq: Every day | ORAL | 1 refills | Status: DC

## 2022-10-14 NOTE — Progress Notes (Signed)
Established Patient Office Visit  Subjective    Patient ID: Max Ryan, male    DOB: [birth year]  Age: 76 y.o. MRN: [id number]  CC:  Chief Complaint  Patient presents with   Follow-up    HPI Max Ryan presents for routine follow up of chronic med issues. Patient denies acute complaints or concerns.    Outpatient Encounter Medications as of 10/14/2022  Medication Sig   aspirin EC 81 MG tablet Take 81 mg by mouth daily. Swallow whole.   colchicine 0.6 MG tablet TAKE 1 TABLET BY MOUTH EVERY DAY   metFORMIN (GLUCOPHAGE) 500 MG tablet TAKE 1 TABLET BY MOUTH TWICE A DAY   methocarbamol (ROBAXIN) 500 MG tablet Take 1 tablet (500 mg total) by mouth 2 (two) times daily as needed for muscle spasms.   tamsulosin (FLOMAX) 0.4 MG CAPS capsule Take 0.4 mg by mouth daily.   [DISCONTINUED] carbidopa-levodopa (SINEMET IR) 25-100 MG tablet Take 1 tablet by mouth at bedtime.   [DISCONTINUED] traZODone (DESYREL) 100 MG tablet Take 100 mg by mouth at bedtime.   atorvastatin (LIPITOR) 40 MG tablet Take 1 tablet (40 mg total) by mouth daily.   carbidopa-levodopa (SINEMET IR) 25-100 MG tablet Take 1 tablet by mouth at bedtime.   losartan (COZAAR) 25 MG tablet Take 1 tablet (25 mg total) by mouth in the morning.   traZODone (DESYREL) 100 MG tablet Take 1 tablet (100 mg total) by mouth at bedtime.   [DISCONTINUED] gabapentin (NEURONTIN) 600 MG tablet Take 1 tablet (600 mg total) by mouth 2 (two) times daily.   [DISCONTINUED] meloxicam (MOBIC) 15 MG tablet Take 15 mg by mouth daily.   No facility-administered encounter medications on file as of 10/14/2022.    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    Past Surgical History:  Procedure  Laterality Date   ANTERIOR CERVICAL DECOMPRESSION/DISCECTOMY FUSION 4 LEVELS N/A 08/17/2019   Procedure: ANTERIOR CERVICAL DECOMPRESSION FUSION CERVICAL THREE-FOUR, CERVICAL FOUR-FIVE, CERVICAL FIVE-SIX, CERVICAL SIX-SEVEN WITH INSTRUMENTATION AND ALLOGRAFT;  Surgeon: Phylliss Bob, MD;  Location: [place];  Service: Orthopedics;  Laterality: N/A;   CARPAL TUNNEL RELEASE Right    CHOLECYSTECTOMY     COLONOSCOPY     CYSTOSCOPY/URETEROSCOPY/HOLMIUM LASER/STENT PLACEMENT Right 07/19/2019   Procedure: CYSTOSCOPY/URETEROSCOPY/HOLMIUM LASER/STENT PLACEMENT;  Surgeon: Ardis Hughs, MD;  [facility];  Service: Urology;  Laterality: Right;   HERNIA REPAIR     JOINT REPLACEMENT Left    knee   KNEE SURGERY Left    KYPHOPLASTY N/A 11/06/2021   Procedure: THORACIC EIGHT, THORACIC NINE KHYPHOPLASTY;  Surgeon: Phylliss Bob, MD;  Location: [place];  Service: Orthopedics;  Laterality: N/A;   LAPAROSCOPIC ROUX-EN-Y GASTRIC BYPASS WITH UPPER ENDOSCOPY AND REMOVAL OF LAP BAND     POSTERIOR CERVICAL FUSION/FORAMINOTOMY N/A 04/26/2020   Procedure: POSTERIOR CERVICAL DECOMPRESSION CERVICAL SIX- THORACIC ONE;  Surgeon: Phylliss Bob, MD;  Location: [place];  Service: Orthopedics;  Laterality: N/A;   SPINAL FUSION  2004   TONSILLECTOMY      Family History  Problem Relation Age of Onset   CAD Father     Social History   Socioeconomic History  Marital status: Married    Spouse name: Not on file   Number of children: Not on file   Years of education: Not on file   Highest education level: Not on file  Occupational History   Not on file  Tobacco Use   Smoking status: Former    Types: Cigarettes, E-cigarettes    Quit date: 2005    Years since quitting: 18.9   Smokeless tobacco: Never  Vaping Use   Vaping Use: Never used  Substance and Sexual Activity   Alcohol use: Not Currently    Alcohol/week: 42.0 standard drinks of alcohol    Types: 42 Glasses of wine per week    Comment: "Haven't had  a drink since 9/1 2022" - per pt   Drug use: Yes    Types: Marijuana    Comment: occ   Sexual activity: Not on file  Other Topics Concern   Not on file  Social History Narrative   Not on file   Social Determinants of Health   Financial Resource Strain: Not on file  Food Insecurity: Not on file  Transportation Needs: Not on file  Physical Activity: Not on file  Stress: Not on file  Social Connections: Not on file  Intimate Partner Violence: Not on file    Review of Systems  All other systems reviewed and are negative.       Objective    BP 110/74   Pulse 70   Temp (!) 96.6 F (35.9 C) (Oral)   Resp 16   Wt 232 lb (105.2 kg)   SpO2 95%   BMI 35.28 kg/m   Physical Exam Vitals and nursing note reviewed.  Constitutional:      General: He is not in acute distress. Cardiovascular:     Rate and Rhythm: Normal rate and regular rhythm.  Pulmonary:     Effort: Pulmonary effort is normal.     Breath sounds: Normal breath sounds.  Abdominal:     Palpations: Abdomen is soft.     Tenderness: There is no abdominal tenderness.  Musculoskeletal:     Right lower leg: No edema.     Left lower leg: No edema.  Neurological:     General: No focal deficit present.     Mental Status: He is alert and oriented to person, place, and time.         Assessment & Plan:   1. 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) A1c appears stable and is at goal. continue - POCT glycosylated hemoglobin (Hb A1C)  2. Essential hypertension Appears stable and at goal.  3. Mixed hyperlipidemia continue  4. Restless leg syndrome Cinemet refilled.   5. Insomnia, unspecified type continue  6. Encounter for Medicare annual wellness exam     Return in about 6 months (around 04/15/2023) for follow up.   Becky Sax, MD

## 2022-10-14 NOTE — Progress Notes (Signed)
Subjective:   Max Ryan is a 76 y.o. male who presents for Medicare Annual/Subsequent preventive examination.  Review of Systems   PCP       Objective:    Today's Vitals   10/14/22 0852  BP: 110/74  Pulse: 70  Resp: 16  Temp: (!) 96.6 F (35.9 C)  TempSrc: Oral  SpO2: 95%  Weight: 232 lb (105.2 kg)   Body mass index is 35.28 kg/m.     08/01/2022    9:51 AM 07/29/2022    1:40 PM 10/29/2021    9:30 AM 04/19/2020   10:28 AM 08/17/2019    9:57 AM 08/11/2019    1:31 PM 07/19/2019    2:06 PM  Advanced Directives  Does Patient Have a Medical Advance Directive? No No No Yes  Yes No  Type of Advance Directive    Living will  [place];Living will   Does patient want to make changes to medical advance directive?    No - Patient declined     Copy of [place] in Chart?     No - copy requested No - copy requested   Would patient like information on creating a medical advance directive? No - Patient declined  No - Patient declined No - Patient declined   No - Patient declined    Current Medications (verified) Outpatient Encounter Medications as of 10/14/2022  Medication Sig   aspirin EC 81 MG tablet Take 81 mg by mouth daily. Swallow whole.   carbidopa-levodopa (SINEMET IR) 25-100 MG tablet Take 1 tablet by mouth at bedtime.   colchicine 0.6 MG tablet TAKE 1 TABLET BY MOUTH EVERY DAY   metFORMIN (GLUCOPHAGE) 500 MG tablet TAKE 1 TABLET BY MOUTH TWICE A DAY   methocarbamol (ROBAXIN) 500 MG tablet Take 1 tablet (500 mg total) by mouth 2 (two) times daily as needed for muscle spasms.   tamsulosin (FLOMAX) 0.4 MG CAPS capsule Take 0.4 mg by mouth daily.   traZODone (DESYREL) 100 MG tablet Take 100 mg by mouth at bedtime.   atorvastatin (LIPITOR) 40 MG tablet Take 1 tablet (40 mg total) by mouth daily.   losartan (COZAAR) 25 MG tablet Take 1 tablet (25 mg total) by mouth in the morning.   [DISCONTINUED] gabapentin (NEURONTIN) 600 MG tablet  Take 1 tablet (600 mg total) by mouth 2 (two) times daily.   [DISCONTINUED] meloxicam (MOBIC) 15 MG tablet Take 15 mg by mouth daily.   No facility-administered encounter medications on file as of 10/14/2022.    Allergies (verified) Patient has no known allergies.   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   Past Surgical History:  Procedure Laterality Date   ANTERIOR CERVICAL DECOMPRESSION/DISCECTOMY FUSION 4 LEVELS N/A 08/17/2019   Procedure: ANTERIOR CERVICAL DECOMPRESSION FUSION CERVICAL THREE-FOUR, CERVICAL FOUR-FIVE, CERVICAL FIVE-SIX, CERVICAL SIX-SEVEN WITH INSTRUMENTATION AND ALLOGRAFT;  Surgeon: Phylliss Bob, MD;  Location: [place];  Service: Orthopedics;  Laterality: N/A;   CARPAL TUNNEL RELEASE Right    CHOLECYSTECTOMY     COLONOSCOPY     CYSTOSCOPY/URETEROSCOPY/HOLMIUM LASER/STENT PLACEMENT Right 07/19/2019   Procedure: CYSTOSCOPY/URETEROSCOPY/HOLMIUM LASER/STENT PLACEMENT;  Surgeon: Louis Meckel,  Viona Gilmore, MD;  [facility];  Service: Urology;  Laterality: Right;   HERNIA REPAIR     JOINT REPLACEMENT Left    knee   KNEE SURGERY Left    KYPHOPLASTY N/A 11/06/2021   Procedure: THORACIC EIGHT, THORACIC NINE KHYPHOPLASTY;  Surgeon: Phylliss Bob, MD;  Location: [place];  Service: Orthopedics;  Laterality: N/A;   LAPAROSCOPIC ROUX-EN-Y GASTRIC BYPASS WITH UPPER ENDOSCOPY AND REMOVAL OF LAP BAND     POSTERIOR CERVICAL FUSION/FORAMINOTOMY N/A 04/26/2020   Procedure: POSTERIOR CERVICAL DECOMPRESSION CERVICAL SIX- THORACIC ONE;  Surgeon: Phylliss Bob, MD;  Location: [place];  Service: Orthopedics;  Laterality: N/A;   SPINAL FUSION  2004   TONSILLECTOMY     Family History  Problem Relation Age of Onset   CAD Father     Social History   Socioeconomic History   Marital status: Married    Spouse name: Not on file   Number of children: Not on file   Years of education: Not on file   Highest education level: Not on file  Occupational History   Not on file  Tobacco Use   Smoking status: Former    Types: Cigarettes, E-cigarettes    Quit date: 2005    Years since quitting: 18.9   Smokeless tobacco: Never  Vaping Use   Vaping Use: Never used  Substance and Sexual Activity   Alcohol use: Not Currently    Alcohol/week: 42.0 standard drinks of alcohol    Types: 42 Glasses of wine per week    Comment: "Haven't had a drink since 9/1 2022" - per pt   Drug use: Yes    Types: Marijuana    Comment: occ   Sexual activity: Not on file  Other Topics Concern   Not on file  Social History Narrative   Not on file   Social Determinants of Health   Financial Resource Strain: Not on file  Food Insecurity: Not on file  Transportation Needs: Not on file  Physical Activity: Not on file  Stress: Not on file  Social Connections: Not on file    Tobacco Counseling Counseling given: Not Answered   Clinical Intake:  Pre-visit preparation completed: No  Pain : No/denies pain     Diabetes: Yes CBG done?: No Did pt. bring in CBG monitor from home?: No     Diabetic?yes   Interpreter Needed?: No      Activities of Daily Living    10/29/2021    9:29 AM 10/29/2021    9:13 AM  In your present state of health, do you have any difficulty performing the following activities:  Walking or climbing stairs? 0   Doing errands, shopping?  0    Patient Care Team: Dorna Mai, MD as PCP - General (Family Medicine) Debara Pickett Nadean Corwin, MD as PCP - Cardiology (Cardiology)  Indicate any recent Medical Services you may have received from other than [facility] providers in the past year (date may be approximate).     Assessment:   This is a routine wellness examination for Max Ryan.  Hearing/Vision  screen No results found.  Dietary issues and exercise activities discussed:     Goals Addressed   None   Depression Screen    10/14/2022    8:49 AM 07/15/2022    9:15 AM 04/02/2022    2:25 PM 03/14/2022    9:38 AM 01/28/2022   11:29 AM  PHQ 2/9 Scores  PHQ - 2 Score 0 0 0 0 0  PHQ- 9  Score 0 0 0  0    Fall Risk     No data to display           FALL RISK PREVENTION PERTAINING TO THE HOME:  Any stairs in or around the home? Yes  If so, are there any without handrails? Yes  Home free of loose throw rugs in walkways, pet beds, electrical cords, etc? Yes  Adequate lighting in your home to reduce risk of falls? Yes   ASSISTIVE DEVICES UTILIZED TO PREVENT FALLS:  Life alert? No  Use of a cane, walker or w/c? No  Grab bars in the bathroom? Yes  Shower chair or bench in shower? No  Elevated toilet seat or a handicapped toilet? No   TIMED UP AND GO:  Was the test performed? No .  Length of time to ambulate 10 feet: 60 sec.   Gait steady and fast with assistive device  Cognitive Function:        Immunizations Immunization History  Administered Date(s) Administered   Influenza Split 09/01/2016   Influenza,inj,Quad PF,6+ Mos 07/15/2022   Influenza-Unspecified 09/02/2021   PFIZER(Purple Top)SARS-COV-2 Vaccination 12/08/2019, 12/29/2019, 11/18/2021   Tdap 09/24/2015    TDAP status: Up to date  Flu Vaccine status: Up to date  Pneumococcal vaccine status: Up to date  Covid-19 vaccine status: Completed vaccines  Qualifies for Shingles Vaccine? Yes   Zostavax completed No   Shingrix Completed?: No.    Education has been provided regarding the importance of this vaccine. Patient has been advised to call insurance company to determine out of pocket expense if they have not yet received this vaccine. Advised may also receive vaccine at local pharmacy or Health Dept. Verbalized acceptance and understanding.  Screening Tests Health Maintenance  Topic Date Due    Medicare Annual Wellness (AWV)  Never done   FOOT EXAM  Never done   Diabetic kidney evaluation - Urine ACR  Never done   Hepatitis C Screening  Never done   Zoster Vaccines- Shingrix (1 of 2) 10/14/2022 (Originally 02/26/1965)   Pneumonia Vaccine 72+ Years old (1 - PCV) 07/16/2023 (Originally 02/25/2013)   COLONOSCOPY (Pts 45-5yr Insurance coverage will need to be confirmed)  07/16/2023 (Originally 02/25/1993)   HEMOGLOBIN A1C  01/13/2023   OPHTHALMOLOGY EXAM  04/15/2023   Diabetic kidney evaluation - GFR measurement  07/30/2023   DTaP/Tdap/Td (2 - Td or Tdap) 09/23/2025   INFLUENZA VACCINE  Completed   HPV VACCINES  Aged Out   COVID-19 Vaccine  Discontinued    Health Maintenance  Health Maintenance Due  Topic Date Due   Medicare Annual Wellness (AWV)  Never done   FOOT EXAM  Never done   Diabetic kidney evaluation - Urine ACR  Never done   Hepatitis C Screening  Never done    Colorectal cancer screening: Type of screening: Colonoscopy. Completed  . Repeat every   years  Lung Cancer Screening: (Low Dose CT Chest recommended if Age 74-80years, 30 pack-year currently smoking OR have quit w/in 15years.) does not qualify.   Lung Cancer Screening Referral: n/a  Additional Screening:  Hepatitis C Screening: does not qualify; Completed   Vision Screening: Recommended annual ophthalmology exams for early detection of glaucoma and other disorders of the eye. Is the patient up to date with their annual eye exam?  Yes  Who is the provider or what is the name of the office in which the patient attends annual eye exams?   If pt is not established  with a provider, would they like to be referred to a provider to establish care? No .   Dental Screening: Recommended annual dental exams for proper oral hygiene  Community Resource Referral / Chronic Care Management: CRR required this visit?  No   CCM required this visit?  No      Plan:     I have personally reviewed and noted the  following in the patient's chart:   Medical and social history Use of alcohol, tobacco or illicit drugs  Current medications and supplements including opioid prescriptions. Patient is not currently taking opioid prescriptions. Functional ability and status Nutritional status Physical activity Advanced directives List of other physicians Hospitalizations, surgeries, and ER visits in previous 12 months Vitals Screenings to include cognitive, depression, and falls Referrals and appointments  In addition, I have reviewed and discussed with patient certain preventive protocols, quality metrics, and best practice recommendations. A written personalized care plan for preventive services as well as general preventive health recommendations were provided to patient.     Melene Plan, RMA   10/14/2022   Nurse Notes:

## 2022-11-04 ENCOUNTER — Ambulatory Visit
Admission: EM | Admit: 2022-11-04 | Discharge: 2022-11-04 | Disposition: A | Discharge status: Home / Self Care | Payer: Medicare HMO | Attending: Family Medicine | Admitting: Family Medicine

## 2022-11-04 DIAGNOSIS — M25511 Pain in right shoulder: Secondary | ICD-10-CM | POA: Diagnosis not present

## 2022-11-04 MED ORDER — PREDNISONE 20 MG PO TABS: 40.0000 mg | ORAL_TABLET | Freq: Every day | ORAL | 0 refills | Status: AC

## 2022-11-04 NOTE — Discharge Instructions (Signed)
Be aware, your blood sugars will rise while taking prednisone. Please monitor closely.

## 2022-11-04 NOTE — ED Provider Notes (Signed)
[place]   [id number] 11/04/22 Arrival Time: 9373  ASSESSMENT & PLAN:  1. Acute pain of right shoulder    No indication for imaging. OTC Tylenol/Advil as needed.  New Prescriptions   PREDNISONE (DELTASONE) 20 MG TABLET    Take 2 tablets (40 mg total) by mouth daily.   Recommend:  Follow-up Information     Dorna Mai, MD.   Specialty: Family Medicine Why: If worsening or failing to improve as anticipated. Contact information: [address] [place] [id number] [phone]                Activities as tolerated.   Discharge Instructions      Be aware, your blood sugars will rise while taking prednisone. Please monitor closely.      Reviewed expectations re: course of current medical issues. Questions answered. Outlined signs and symptoms indicating need for more acute intervention. Patient verbalized understanding. After Visit Summary given.  SUBJECTIVE: History from: patient. Max Ryan is a 76 y.o. male who reports right shoulder pain; gradual onset; x 1 week; "feels like arthritis". No trauma. Prednisone has helped in the past. Pt has been taking tylenol at home.  No extremity sensation changes or weakness.   Past Surgical History:  Procedure Laterality Date   ANTERIOR CERVICAL DECOMPRESSION/DISCECTOMY FUSION 4 LEVELS N/A 08/17/2019   Procedure: ANTERIOR CERVICAL DECOMPRESSION FUSION CERVICAL THREE-FOUR, CERVICAL FOUR-FIVE, CERVICAL FIVE-SIX, CERVICAL SIX-SEVEN WITH INSTRUMENTATION AND ALLOGRAFT;  Surgeon: Phylliss Bob, MD;  Location: [place];  Service: Orthopedics;  Laterality: N/A;   CARPAL TUNNEL RELEASE Right    CHOLECYSTECTOMY     COLONOSCOPY     CYSTOSCOPY/URETEROSCOPY/HOLMIUM LASER/STENT PLACEMENT Right 07/19/2019   Procedure: CYSTOSCOPY/URETEROSCOPY/HOLMIUM LASER/STENT PLACEMENT;  Surgeon: Ardis Hughs, MD;  [facility];  Service: Urology;  Laterality: Right;   HERNIA REPAIR     JOINT  REPLACEMENT Left    knee   KNEE SURGERY Left    KYPHOPLASTY N/A 11/06/2021   Procedure: THORACIC EIGHT, THORACIC NINE KHYPHOPLASTY;  Surgeon: Phylliss Bob, MD;  Location: [place];  Service: Orthopedics;  Laterality: N/A;   LAPAROSCOPIC ROUX-EN-Y GASTRIC BYPASS WITH UPPER ENDOSCOPY AND REMOVAL OF LAP BAND     POSTERIOR CERVICAL FUSION/FORAMINOTOMY N/A 04/26/2020   Procedure: POSTERIOR CERVICAL DECOMPRESSION CERVICAL SIX- THORACIC ONE;  Surgeon: Phylliss Bob, MD;  Location: [place];  Service: Orthopedics;  Laterality: N/A;   SPINAL FUSION  2004   TONSILLECTOMY        OBJECTIVE:  Vitals:   11/04/22 0858  BP: (!) 146/80  Pulse: 66  Resp: 16  Temp: 97.8 F (36.6 C)  SpO2: 98%    General appearance: alert; no distress HEENT: [place]; AT Neck: supple with FROM Resp: unlabored respirations Extremities: RUE: warm with well perfused appearance; fairly well localized moderate tenderness over right anterior shoulder; without gross deformities; swelling: none; bruising: none; shoulder ROM: normal CV: brisk extremity capillary refill of RUE; 2+ radial pulse of RUE. Skin: warm and dry; no visible rashes Neurologic: gait normal; normal sensation and strength of RUE Psychological: alert and cooperative; normal mood and affect    No Known Allergies  Past Medical History:  Diagnosis Date   Cancer ([place])    skin cancer   Coronary artery disease    Diabetes mellitus without complication ([place])    History of kidney stones    Hypertension    Neuropathy    bilateral feet   Sarcoidosis    Stroke ([place])    stroke in right  eye per patient   Social History   Socioeconomic History   Marital status: Married    Spouse name: Not on file   Number of children: Not on file   Years of education: Not on file   Highest education level: Not on file  Occupational History   Not on file  Tobacco Use   Smoking status: Former    Types: Cigarettes, E-cigarettes    Quit date: 2005    Years since  quitting: 18.9   Smokeless tobacco: Never  Vaping Use   Vaping Use: Never used  Substance and Sexual Activity   Alcohol use: Not Currently    Alcohol/week: 32.0 standard drinks of alcohol    Types: 32 Glasses of wine per week   Drug use: Yes    Types: Marijuana    Comment: occ   Sexual activity: Not on file  Other Topics Concern   Not on file  Social History Narrative   Not on file   Social Determinants of Health   Financial Resource Strain: Not on file  Food Insecurity: Not on file  Transportation Needs: Not on file  Physical Activity: Not on file  Stress: Not on file  Social Connections: Not on file   Family History  Problem Relation Age of Onset   CAD Father    Past Surgical History:  Procedure Laterality Date   ANTERIOR CERVICAL DECOMPRESSION/DISCECTOMY FUSION 4 LEVELS N/A 08/17/2019   Procedure: ANTERIOR CERVICAL DECOMPRESSION FUSION CERVICAL THREE-FOUR, CERVICAL FOUR-FIVE, CERVICAL FIVE-SIX, CERVICAL SIX-SEVEN WITH INSTRUMENTATION AND ALLOGRAFT;  Surgeon: Phylliss Bob, MD;  Location: [place];  Service: Orthopedics;  Laterality: N/A;   CARPAL TUNNEL RELEASE Right    CHOLECYSTECTOMY     COLONOSCOPY     CYSTOSCOPY/URETEROSCOPY/HOLMIUM LASER/STENT PLACEMENT Right 07/19/2019   Procedure: CYSTOSCOPY/URETEROSCOPY/HOLMIUM LASER/STENT PLACEMENT;  Surgeon: Ardis Hughs, MD;  [facility];  Service: Urology;  Laterality: Right;   HERNIA REPAIR     JOINT REPLACEMENT Left    knee   KNEE SURGERY Left    KYPHOPLASTY N/A 11/06/2021   Procedure: THORACIC EIGHT, THORACIC NINE KHYPHOPLASTY;  Surgeon: Phylliss Bob, MD;  Location: [place];  Service: Orthopedics;  Laterality: N/A;   LAPAROSCOPIC ROUX-EN-Y GASTRIC BYPASS WITH UPPER ENDOSCOPY AND REMOVAL OF LAP BAND     POSTERIOR CERVICAL FUSION/FORAMINOTOMY N/A 04/26/2020   Procedure: POSTERIOR CERVICAL DECOMPRESSION CERVICAL SIX- THORACIC ONE;  Surgeon: Phylliss Bob, MD;  Location: [place];  Service: Orthopedics;   Laterality: N/A;   SPINAL FUSION  2004   TONSILLECTOMY         Vanessa Kick, MD 11/04/22 [id number]

## 2022-11-04 NOTE — ED Triage Notes (Signed)
Pt presents for 1 weeks of right shoulder pain. 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 Pt has been taking tylenol at home.

## 2022-11-09 ENCOUNTER — Other Ambulatory Visit: Payer: Self-pay | Admitting: Family

## 2022-11-09 DIAGNOSIS — E782 Mixed hyperlipidemia: Secondary | ICD-10-CM

## 2022-11-19 ENCOUNTER — Other Ambulatory Visit: Payer: Self-pay | Admitting: Family

## 2022-11-19 DIAGNOSIS — I10 Essential (primary) hypertension: Secondary | ICD-10-CM

## 2022-12-10 ENCOUNTER — Telehealth (HOSPITAL_COMMUNITY): Payer: Self-pay | Admitting: Internal Medicine

## 2022-12-10 NOTE — Telephone Encounter (Signed)
I called patient to schedule his yearly echocardiogram in March.  Patient did not wish to schedule. He is relocating and will not be here. Order will be removed from the ECHO WQ.

## 2023-02-14 ENCOUNTER — Other Ambulatory Visit: Payer: Self-pay | Admitting: Family Medicine

## 2023-02-14 DIAGNOSIS — I10 Essential (primary) hypertension: Secondary | ICD-10-CM

## 2023-03-15 IMAGING — CT CT HEART MORP W/ CTA COR W/ SCORE W/ CA W/CM &/OR W/O CM
4 of 7 series · 8 of 20 positions shown, 9 images · non-contrast
Comparison: None.
COMPARISON: None.

Addendum:
EXAM:
OVER-READ INTERPRETATION  CT CHEST

The following report is an over-read performed by radiologist Dr.
Qeymy Sfiq [REDACTED] on 07/31/2021. This
over-read does not include interpretation of cardiac or coronary
anatomy or pathology. The coronary calcium score/coronary CTA
interpretation by the cardiologist is attached.
CLINICAL DATA: CAD, pulmonary sarcoid Dyspnea
Cardiac CTA
MEDICATIONS:
Sub lingual nitro. 4 mg and lopressor 50mg
TECHNIQUE: The patient was scanned on a Siemens Force 192 scanner. Gantry
rotation speed was 250 msecs. Collimation was. 6 mm . A 120 kV
prospective scan was triggered in the ascending thoracic aorta at
140 HU's with full mA between 30-70% of the R-R interval . Average
HR during the scan was 50 bpm. The 3D data set was interpreted on a
dedicated work station using MPR, MIP and VRT modes. A total of 80
cc of contrast was used.

[Series 6: best syst · axial · 0.41mm/px · z∈[+1148,+1192]mm · 2 of 332 slices shown, 3 images]
[im 111/332  vessel]
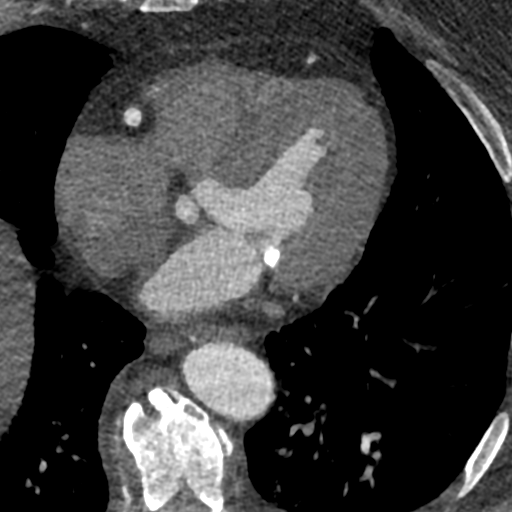
[im 111/332  lung]
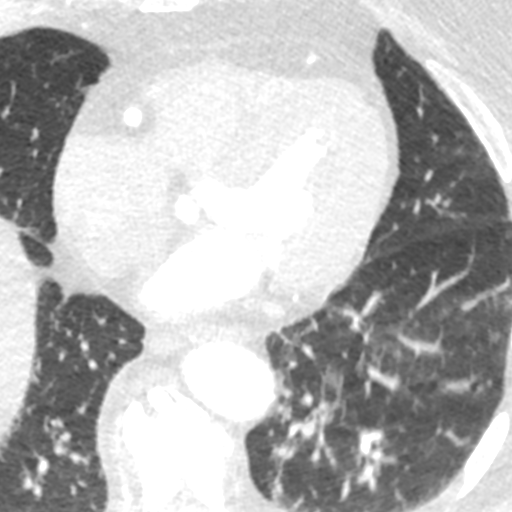
[im 221/332  vessel]
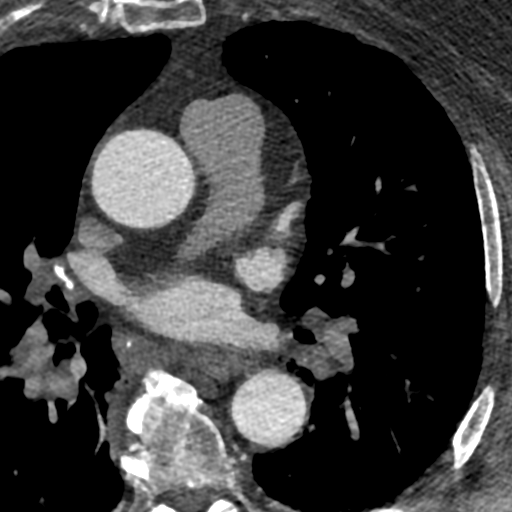

[Series 7: best diast · axial · 0.41mm/px · z∈[+1148,+1193]mm · 2 of 336 slices shown]
[im 112/336  vessel]
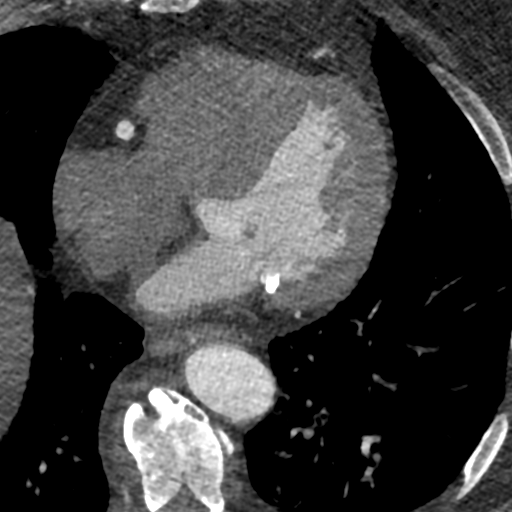
[im 224/336  vessel]
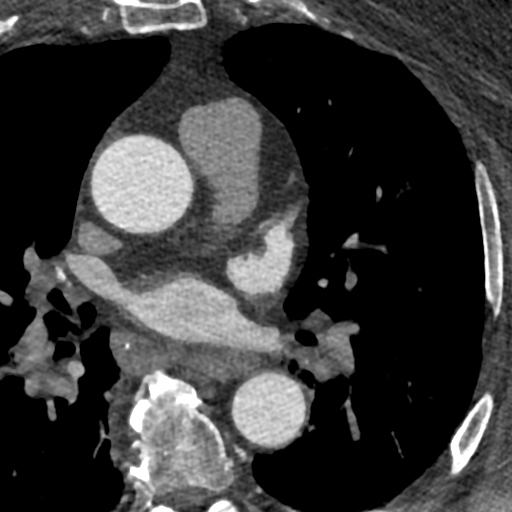

[Series 8: ts diast sharp 67 % · axial · 0.41mm/px · z∈[+1148,+1193]mm · 2 of 332 slices shown]
[im 111/332  lung]
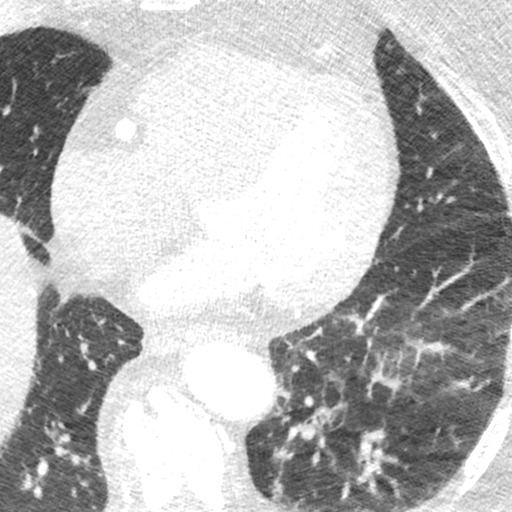
[im 221/332  lung]
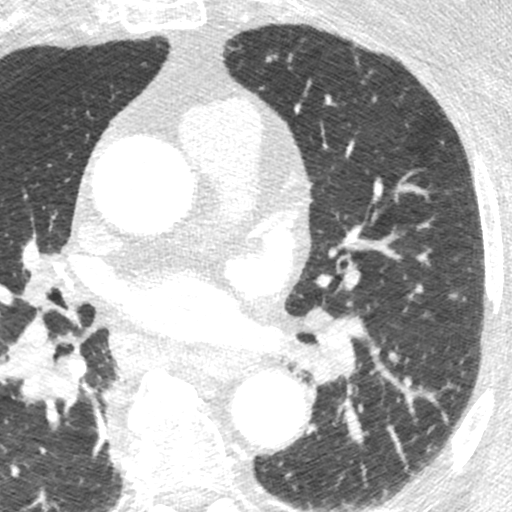

[Series 9: ts syst sharp 50 % · axial · 0.41mm/px · z∈[+1148,+1193]mm · 2 of 336 slices shown]
[im 112/336  lung]
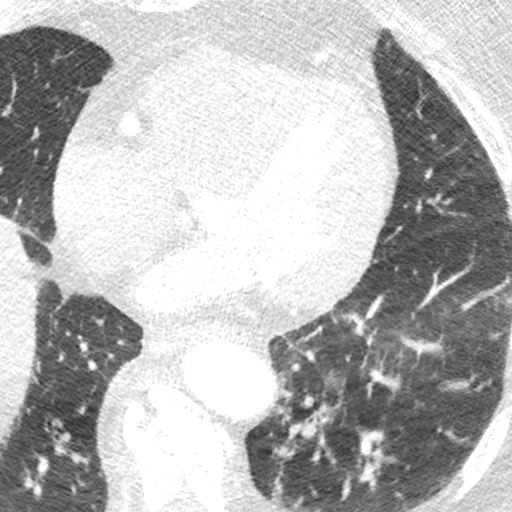
[im 224/336  lung]
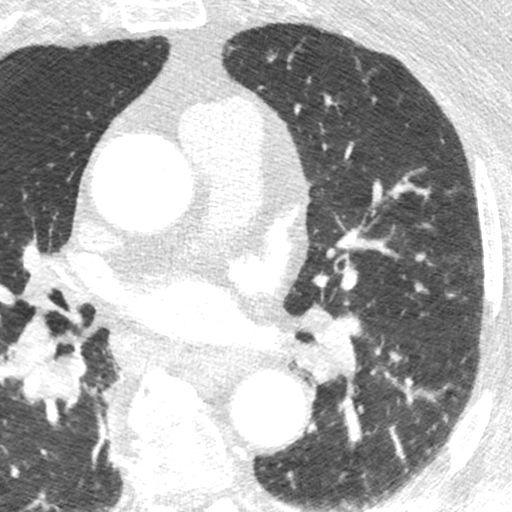

[8 of 20 positions shown; findings below may reference images not displayed]

FINDINGS: Atherosclerotic calcifications in the thoracic aorta. Numerous
densely calcified mediastinal and bilateral hilar lymph nodes are
incidentally noted. Within the visualized portions of the thorax
there are no suspicious appearing pulmonary nodules or masses, there
is no acute consolidative airspace disease, no pleural effusions, no
pneumothorax and no lymphadenopathy. Visualized portions of the
upper abdomen incompletely image what appears to be a LapBand. There
are no aggressive appearing lytic or blastic lesions noted in the
visualized portions of the skeleton.
IMPRESSION: 1.  Aortic Atherosclerosis (H2S7R-JB0.0).
FINDINGS: Non-cardiac: See separate report from [REDACTED]. No
significant findings on limited lung and soft tissue windows.

Calcium score: LM and 3 vessel coronary calcium noted

Coronary Arteries: Right dominant with no anomalies

LM: 1-24% calcified ostial plaque

LAD: 25-49% long area of calcified plaque proximally 1-24% calcified
plaque in mid vessel

IM: 1-24% calcified plaque in proximal vessel

D1: 1-24% calcified plaque proximally and 25-49% calcified plaque in
mid vessel

D2: Normal

Circumflex: 1-24% calcified plaque in proximal and mid vessel

OM1: Normal

AV Groove: Normal

RCA: 1-24% calcified plaque in proximal/mid and distal RCA

PDA: Normal

PLA: 1-24% calcific plaque
IMPRESSION: 1. LM and 3 vessel calcium with score 1611 which is 88 th percentile
for age / sex

2.  Severe mitral annular calcification

3.  Mildly dilated ascending thoracic aorta 3.9 cm

4.  Dilated mPA 3.6 cm with rPA proximally 3.1 cm and lPA 2.5 cm

5. See radiology report regarding numerous densely calcified
mediastinal/ hilar lymph nodes? Consistent with sarcoid

6.  CAD RADS 2 non obstructive CAD see description above

Taran Hester

*** End of Addendum ***
EXAM:
OVER-READ INTERPRETATION  CT CHEST

The following report is an over-read performed by radiologist Dr.
Qeymy Sfiq [REDACTED] on 07/31/2021. This
over-read does not include interpretation of cardiac or coronary
anatomy or pathology. The coronary calcium score/coronary CTA
interpretation by the cardiologist is attached.
FINDINGS: Atherosclerotic calcifications in the thoracic aorta. Numerous
densely calcified mediastinal and bilateral hilar lymph nodes are
incidentally noted. Within the visualized portions of the thorax
there are no suspicious appearing pulmonary nodules or masses, there
is no acute consolidative airspace disease, no pleural effusions, no
pneumothorax and no lymphadenopathy. Visualized portions of the
upper abdomen incompletely image what appears to be a LapBand. There
are no aggressive appearing lytic or blastic lesions noted in the
visualized portions of the skeleton.
IMPRESSION: 1.  Aortic Atherosclerosis (H2S7R-JB0.0).

## 2023-03-27 ENCOUNTER — Other Ambulatory Visit: Payer: Self-pay | Admitting: Family Medicine

## 2023-03-27 DIAGNOSIS — E119 Type 2 diabetes mellitus without complications: Secondary | ICD-10-CM

## 2023-03-27 NOTE — Telephone Encounter (Signed)
Requested Prescriptions  Pending Prescriptions Disp Refills   metFORMIN (GLUCOPHAGE) 500 MG tablet [Pharmacy Med Name: METFORMIN HCL 500 MG TABLET] 180 tablet 0    Sig: TAKE 1 TABLET BY MOUTH TWICE A DAY     Endocrinology:  Diabetes - Biguanides Failed - 03/27/2023  2:45 AM      Failed - B12 Level in normal range and within 720 days    No results found for: "VITAMINB12"       Passed - Cr in normal range and within 360 days    Creatinine, Ser  Date Value Ref Range Status  07/29/2022 0.95 0.61 - 1.24 mg/dL Final         Passed - HBA1C is between 0 and 7.9 and within 180 days    Hemoglobin A1C  Date Value Ref Range Status  10/14/2022 6.3 (A) 4.0 - 5.6 % Final   Hgb A1c MFr Bld  Date Value Ref Range Status  07/15/2022 6.2 (H) 4.8 - 5.6 % Final    Comment:             Prediabetes: 5.7 - 6.4          Diabetes: >6.4          Glycemic control for adults with diabetes: <7.0          Passed - eGFR in normal range and within 360 days    GFR calc Af Amer  Date Value Ref Range Status  04/19/2020 >60 >60 mL/min Final   GFR, Estimated  Date Value Ref Range Status  07/29/2022 >60 >60 mL/min Final    Comment:    (NOTE) Calculated using the CKD-EPI Creatinine Equation (2021)    eGFR  Date Value Ref Range Status  03/14/2022 95 >59 mL/min/1.73 Final         Passed - Valid encounter within last 6 months    Recent Outpatient Visits           5 months ago 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)   [place] [facility], MD   8 months ago Essential (primary) hypertension   [place] [facility], Amy J, NP   11 months ago 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, unspecified cause   [place] [facility], MD   1 year ago Essential (primary) hypertension   [place] [facility] [place], [place]   1 year ago    [facility], [place], [place]       Future Appointments             In 1 month Georganna Skeans, MD [facility] - CBC within normal limits and completed in the last 12 months    WBC  Date Value Ref Range Status  07/29/2022 6.1 4.0 - 10.5 K/uL Final   RBC  Date Value Ref Range Status  07/29/2022 3.91 (L) 4.22 - 5.81 MIL/uL Final   Hemoglobin  Date Value Ref Range Status  07/29/2022 12.9 (L) 13.0 - 17.0 g/dL Final   HCT  Date Value Ref Range Status  07/29/2022 38.9 (L) 39.0 - 52.0 % Final   MCHC  Date Value Ref Range Status  07/29/2022 33.2 30.0 - 36.0 g/dL Final   [facility]  Date Value Ref  Range Status  07/29/2022 33.0 26.0 - 34.0 pg Final   MCV  Date Value Ref Range Status  07/29/2022 99.5 80.0 - 100.0 fL Final   No results found for: "PLTCOUNTKUC", "LABPLAT", "POCPLA" RDW  Date Value Ref Range Status  07/29/2022 12.7 11.5 - 15.5 % Final

## 2023-04-09 ENCOUNTER — Other Ambulatory Visit: Payer: Self-pay | Admitting: Family Medicine

## 2023-04-28 ENCOUNTER — Ambulatory Visit: Payer: Medicare HMO | Admitting: Family Medicine

## 2023-05-01 ENCOUNTER — Other Ambulatory Visit: Payer: Self-pay | Admitting: Family

## 2023-05-01 DIAGNOSIS — E782 Mixed hyperlipidemia: Secondary | ICD-10-CM

## 2023-06-26 ENCOUNTER — Other Ambulatory Visit: Payer: Self-pay | Admitting: Family Medicine

## 2023-06-26 DIAGNOSIS — E119 Type 2 diabetes mellitus without complications: Secondary | ICD-10-CM

## 2023-07-24 ENCOUNTER — Other Ambulatory Visit: Payer: Self-pay | Admitting: Family Medicine

## 2023-07-24 ENCOUNTER — Other Ambulatory Visit: Payer: Self-pay

## 2023-07-24 DIAGNOSIS — E782 Mixed hyperlipidemia: Secondary | ICD-10-CM

## 2023-07-24 MED ORDER — ATORVASTATIN CALCIUM 40 MG PO TABS: 40.0000 mg | ORAL_TABLET | Freq: Every day | ORAL | 0 refills | Status: AC

## 2023-08-24 ENCOUNTER — Ambulatory Visit: Payer: Medicare HMO | Admitting: Podiatry

## 2023-09-18 ENCOUNTER — Other Ambulatory Visit: Payer: Self-pay | Admitting: Family Medicine

## 2023-09-18 DIAGNOSIS — E119 Type 2 diabetes mellitus without complications: Secondary | ICD-10-CM

## 2023-10-04 ENCOUNTER — Other Ambulatory Visit: Payer: Self-pay | Admitting: Family Medicine

## 2023-10-04 DIAGNOSIS — E119 Type 2 diabetes mellitus without complications: Secondary | ICD-10-CM
